# Patient Record
Sex: Female | Born: 1980
Health system: Southern US, Community
[De-identification: ages and names within clinical notes are randomized; demographics above are authoritative.]

## PROBLEM LIST (undated history)

## (undated) DIAGNOSIS — O139 Gestational [pregnancy-induced] hypertension without significant proteinuria, unspecified trimester: Secondary | ICD-10-CM

## (undated) DIAGNOSIS — T8859XA Other complications of anesthesia, initial encounter: Secondary | ICD-10-CM

## (undated) DIAGNOSIS — T4145XA Adverse effect of unspecified anesthetic, initial encounter: Secondary | ICD-10-CM

## (undated) HISTORY — PX: WISDOM TOOTH EXTRACTION: SHX21

## (undated) HISTORY — DX: Other complications of anesthesia, initial encounter: T88.59XA

## (undated) HISTORY — DX: Adverse effect of unspecified anesthetic, initial encounter: T41.45XA

## (undated) HISTORY — DX: Morbid (severe) obesity due to excess calories: E66.01

---
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
## 2015-05-20 ENCOUNTER — Encounter: Payer: Self-pay | Admitting: Physician Assistant

## 2015-05-20 ENCOUNTER — Ambulatory Visit: Payer: Self-pay | Admitting: Family

## 2015-05-20 VITALS — BP 130/90

## 2015-05-20 DIAGNOSIS — K0889 Other specified disorders of teeth and supporting structures: Secondary | ICD-10-CM

## 2015-05-20 MED ORDER — OXYCODONE-ACETAMINOPHEN 5-325 MG PO TABS: 1.0000 | ORAL_TABLET | ORAL | Status: DC | PRN

## 2015-05-20 MED ORDER — PENICILLIN V POTASSIUM 500 MG PO TABS: 500.0000 mg | ORAL_TABLET | Freq: Four times a day (QID) | ORAL | Status: DC

## 2015-05-20 NOTE — Progress Notes (Signed)
S/ 6 d hx of toothache, her dental insurance has not started. Severe pain and inability to close jaw L , no fever or systemic sxs  O/ swelling and abcess left gum. With tenderness In acute distress , left jaw swollen, + cervical nodes  A/ Toothache /abcess with adenitis  P / PCN 500 mg . Tylenol /HC 5 mg i po q 4-6 hours prn #20 0 rf. Back up method x one month. Saline gargles. Topical ice or heat. Cold fluids If sxs not improving seek urgent care.

## 2015-10-20 ENCOUNTER — Emergency Department
Admission: EM | Admit: 2015-10-20 | Discharge: 2015-10-20 | Disposition: A | Discharge status: Home / Self Care | Payer: 59 | Attending: Emergency Medicine | Admitting: Emergency Medicine

## 2015-10-20 ENCOUNTER — Emergency Department: Payer: 59

## 2015-10-20 ENCOUNTER — Encounter: Payer: Self-pay | Admitting: Emergency Medicine

## 2015-10-20 DIAGNOSIS — G8918 Other acute postprocedural pain: Secondary | ICD-10-CM | POA: Insufficient documentation

## 2015-10-20 DIAGNOSIS — R220 Localized swelling, mass and lump, head: Secondary | ICD-10-CM | POA: Diagnosis not present

## 2015-10-20 DIAGNOSIS — K08409 Partial loss of teeth, unspecified cause, unspecified class: Secondary | ICD-10-CM | POA: Diagnosis not present

## 2015-10-20 DIAGNOSIS — Z98818 Other dental procedure status: Secondary | ICD-10-CM

## 2015-10-20 DIAGNOSIS — R51 Headache: Secondary | ICD-10-CM | POA: Diagnosis not present

## 2015-10-20 DIAGNOSIS — R519 Headache, unspecified: Secondary | ICD-10-CM

## 2015-10-20 LAB — CBC WITH DIFFERENTIAL/PLATELET
BASOS ABS: 0.1 10*3/uL (ref 0–0.1)
Basophils Relative: 1 %
EOS PCT: 4 %
Eosinophils Absolute: 0.6 10*3/uL (ref 0–0.7)
HEMATOCRIT: 34.8 % — AB (ref 35.0–47.0)
Hemoglobin: 11.4 g/dL — ABNORMAL LOW (ref 12.0–16.0)
LYMPHS PCT: 17 %
Lymphs Abs: 2.4 10*3/uL (ref 1.0–3.6)
MCH: 25.8 pg — ABNORMAL LOW (ref 26.0–34.0)
MCHC: 32.7 g/dL (ref 32.0–36.0)
MCV: 78.9 fL — AB (ref 80.0–100.0)
MONO ABS: 0.8 10*3/uL (ref 0.2–0.9)
MONOS PCT: 5 %
NEUTROS ABS: 10.7 10*3/uL — AB (ref 1.4–6.5)
Neutrophils Relative %: 73 %
PLATELETS: 301 10*3/uL (ref 150–440)
RBC: 4.41 MIL/uL (ref 3.80–5.20)
RDW: 16.4 % — AB (ref 11.5–14.5)
WBC: 14.6 10*3/uL — ABNORMAL HIGH (ref 3.6–11.0)

## 2015-10-20 LAB — BASIC METABOLIC PANEL
ANION GAP: 7 (ref 5–15)
BUN: 13 mg/dL (ref 6–20)
CHLORIDE: 104 mmol/L (ref 101–111)
CO2: 23 mmol/L (ref 22–32)
Calcium: 8.6 mg/dL — ABNORMAL LOW (ref 8.9–10.3)
Creatinine, Ser: 0.93 mg/dL (ref 0.44–1.00)
GFR calc non Af Amer: >60 mL/min (ref >60)
Glucose, Bld: 108 mg/dL — ABNORMAL HIGH (ref 65–99)
POTASSIUM: 3.4 mmol/L — AB (ref 3.5–5.1)
Sodium: 134 mmol/L — ABNORMAL LOW (ref 135–145)

## 2015-10-20 MED ORDER — HYDROMORPHONE HCL 1 MG/ML IJ SOLN
0.5000 mg | Freq: Once | INTRAMUSCULAR | Status: AC
  Administered 2015-10-20: 0.5 mg via INTRAVENOUS
  Filled 2015-10-20: qty 1

## 2015-10-20 MED ORDER — LIDOCAINE VISCOUS 2 % MT SOLN: 20.0000 mL | OROMUCOSAL | Status: DC | PRN

## 2015-10-20 MED ORDER — SODIUM CHLORIDE 0.9 % IV SOLN
Freq: Once | INTRAVENOUS | Status: AC
  Administered 2015-10-20: 08:00:00 via INTRAVENOUS

## 2015-10-20 MED ORDER — LIDOCAINE VISCOUS 2 % MT SOLN
15.0000 mL | Freq: Once | OROMUCOSAL | Status: AC
  Administered 2015-10-20: 15 mL via OROMUCOSAL
  Filled 2015-10-20: qty 15

## 2015-10-20 MED ORDER — SODIUM CHLORIDE 0.9 % IV SOLN
3.0000 g | Freq: Once | INTRAVENOUS | Status: AC
  Administered 2015-10-20: 3 g via INTRAVENOUS
  Filled 2015-10-20: qty 3

## 2015-10-20 MED ORDER — DEXAMETHASONE SODIUM PHOSPHATE 10 MG/ML IJ SOLN
10.0000 mg | Freq: Once | INTRAMUSCULAR | Status: AC
  Administered 2015-10-20: 10 mg via INTRAVENOUS
  Filled 2015-10-20: qty 1

## 2015-10-20 MED ORDER — IOPAMIDOL (ISOVUE-300) INJECTION 61%
75.0000 mL | Freq: Once | INTRAVENOUS | Status: AC | PRN
  Administered 2015-10-20: 75 mL via INTRAVENOUS

## 2015-10-20 MED ORDER — IOHEXOL 300 MG/ML  SOLN: 75.0000 mL | Freq: Once | INTRAMUSCULAR | Status: DC | PRN

## 2015-10-20 MED ORDER — CLINDAMYCIN HCL 300 MG PO CAPS: 300.0000 mg | ORAL_CAPSULE | Freq: Three times a day (TID) | ORAL | Status: DC

## 2015-10-20 MED ORDER — OXYCODONE-ACETAMINOPHEN 7.5-325 MG PO TABS: 1.0000 | ORAL_TABLET | ORAL | Status: AC | PRN

## 2015-10-20 NOTE — ED Provider Notes (Signed)
[facility] Emergency Department Provider Note     Time seen: ----------------------------------------- 8:30 AM on 10/20/2015 -----------------------------------------    I have reviewed the triage vital signs and the nursing notes.   HISTORY  Chief Complaint Facial Swelling    HPI Kim Haley is a 35 y.o. female who presents to ER after having swelling to her face since Monday since having her wisdom teeth removed. Patient describes severe pain in the right upper and lower jaw. Patient states during the was tooth extraction she woke up and had to be physically restrained. She describes the surgeon hurriedly taking out the wisdom teeth on the right side. It is the side she has pain on, she denies any fever, denies any difficulty swallowing.   History reviewed. No pertinent past medical history.  There are no active problems to display for this patient.   History reviewed. No pertinent past surgical history.  Allergies Review of patient's allergies indicates no known allergies.  Social History Social History  Substance Use Topics  . Smoking status: Never Smoker   . Smokeless tobacco: None  . Alcohol Use: No    Review of Systems Constitutional: Negative for fever. ENT: Positive for severe right-sided jaw pain Cardiovascular: Negative for chest pain. Respiratory: Negative for shortness of breath. Gastrointestinal: Negative for abdominal pain, vomiting and diarrhea. Skin: Negative for rash.  ____________________________________________   PHYSICAL EXAM:  VITAL SIGNS: ED Triage Vitals  Enc Vitals Group     BP 10/20/15 0723 145/95 mmHg     Pulse Rate 10/20/15 0723 130     Resp 10/20/15 0723 20     Temp 10/20/15 0723 98.3 F (36.8 C)     Temp Source 10/20/15 0723 Oral     SpO2 10/20/15 0723 99 %     Weight 10/20/15 0723 250 lb (113.399 kg)     Height 10/20/15 0723  (1.651 m)     Head Cir --      Peak Flow --      Pain  Score 10/20/15 0723 10     Pain Loc --      Pain Edu? --      Excl. in GC? --     Constitutional: Alert and oriented. Anxious, mild distress Eyes: Conjunctivae are normal. PERRL. Normal extraocular movements. ENT   Head: Normocephalic and atraumatic.   Nose: No congestion/rhinnorhea.   Mouth/Throat: Patient has evidence of trismus, posterior pharynx is difficult to assess.   Neck: No stridor. No adenopathy is noted, floor the mouth is soft Cardiovascular: Rapid rate, regular rhythm. Normal and symmetric distal pulses are present in all extremities. No murmurs, rubs, or gallops. Respiratory: Normal respiratory effort without tachypnea nor retractions. Breath sounds are clear and equal bilaterally. No wheezes/rales/rhonchi. Musculoskeletal: Nontender with normal range of motion in all extremities.  Neurologic:  Normal speech and language.  Skin:  Skin is warm, dry and intact. No rash noted. ____________________________________________  ED COURSE:  Pertinent labs & imaging results that were available during my care of the patient were reviewed by me and considered in my medical decision making (see chart for details). Patient presents status post wisdom tooth extraction with severe right-sided jaw pain, we'll obtain basic labs, give IV Decadron and Unasyn to cover for developing abscess. ____________________________________________    LABS (pertinent positives/negatives)  Labs Reviewed  CBC WITH DIFFERENTIAL/PLATELET - Abnormal; Notable for the following:    WBC 14.6 (*)    Hemoglobin 11.4 (*)    HCT 34.8 (*)  MCV 78.9 (*)    MCH 25.8 (*)    RDW 16.4 (*)    Neutro Abs 10.7 (*)    All other components within normal limits  BASIC METABOLIC PANEL - Abnormal; Notable for the following:    Sodium 134 (*)    Potassium 3.4 (*)    Glucose, Bld 108 (*)    Calcium 8.6 (*)    All other components within normal limits  POC URINE PREG, ED    RADIOLOGY Images were viewed  by me  CT maxillofacial with contrast  IMPRESSION: Status post superior and inferior third molar extractions bilaterally with bony thinning in the areas of the extraction. No enhancement or abscess is noted in these areas. Note that there is disruption of cortex between the left superior alveolar ridge in inferior maxillary antrum on the left at the site of third molar extraction. This disruption increases risk of potential infection from the left maxillary sinus in the area of extraction. This finding will warrant close clinical surveillance.  Multifocal sinusitis and polyposis. Note that there is widening of the ostiomeatal unit complex on the left with obstruction, likely due to polyposis. There is left nasal polyposis with left nares obstruction. There is an air-fluid level in the left maxillary antrum which could represent either infection or potential hemorrhage from the recent third molar extraction.  Mild lymph node prominence inferior to the right mandible, likely of reactive etiology.  Bony structures elsewhere appear unremarkable. No intraorbital lesions. Pharyngeal regions appear normal. ____________________________________________  FINAL ASSESSMENT AND PLAN  Postoperative pain  Plan: Patient with labs and imaging as dictated above. Patient is currently feeling better, I will increase her pain medicine at home and switch her to clindamycin. She cannot see use viscous lidocaine as needed. I will advise close follow-up with her oral surgeon.   Emily FilbertWilliams, Courtland Reas E, MD   Emily FilbertJonathan E Romy Ipock, MD 10/20/15 [phone]

## 2015-10-20 NOTE — ED Notes (Signed)
Pt to ed with c/o swelling to face since Monday after having wisdom teeth removed.  Pt tearful heart rate 130.

## 2015-10-20 NOTE — Discharge Instructions (Signed)
Pain Relief Preoperatively and Postoperatively If you have questions, problems, or concerns about the pain that you may feel after surgery, let your health care provider know.Patients have the right to assessment and management of pain. Severe pain after surgery--and the fear or anxiety associated with that pain--may cause extreme discomfort that:  Prevents sleep.  Decreases the ability to breathe deeply and to cough. This can result in pneumonia or other upper airway infections.  Causes the heart to beat more quickly and the blood pressure to be higher.  Increases the risk for constipation and bloating.  Decreases the ability of wounds to heal.  May result in depression, increased anxiety, and feelings of helplessness. Relieving pain before surgery (preoperatively) is also important because it lessens pain that you have after surgery (postoperatively). Patients who receive pain relief both before and after surgery experience greater pain relief than those who receive pain relief only after surgery. Let your health care provider know if you are having uncontrolled pain.This is very important.Pain after surgery is more difficult to manage if it is severe, so receiving prompt and adequate treatment of acute pain is necessary. If you become constipated after taking pain medicine, drink more liquids if you can. Your health care provider may have you take a mild laxative. PAIN CONTROL METHODS Your health care providers follow policies and procedures about the management of your pain.These guidelines should be explained to you before surgery.Plans for pain control after surgery must be decided upon by you and your health care provider and put into use with your full understanding and agreement.Do not be afraid to ask questions about the care that you are receiving. Your health care providers will attempt to control your pain in various ways, and these methods may be used together (multimodal  analgesia). Using this approach has many benefits for you, including being able to eat, move around, and leave the hospital sooner. As-Needed Pain Control  You may be given pain medicine through an IV tube or as a pill or liquid that you can swallow. Let your health care provider know when you are having pain, and he or she will give you the pain medicine that is ordered for you. IV Patient-Controlled Analgesia (PCA) Pump  You can receive your pain medicine through an IV tube that goes into one of your veins. You can control the amount of pain medicine that you get. The pain medicine is controlled by a pump. When you push the button that is hooked up to this pump, you receive a specific amount of pain medicine. This button should be pushed only by you or by someone who is specifically assigned by you to do so. It is set up to keep you from accidentally giving yourself too much pain medicine. You will be able to start using your pain pump in the recovery room after your surgery. This method can be helpful for most types of surgery.  Tell your health care provider:  If you are having too much pain.  If you are feeling too sleepy or nauseous. Continuous Epidural Pain Control  A thin, soft tube (catheter) is put into your back, outside the outer layer of your spinal cord. Pain medicine flows through the catheter to lessen pain in areas of your body that are below the level of catheter placement. Continuous epidural pain control may work best for you if you are having surgery on your abdomen, hip area, or legs. The epidural catheter is usually put into your back shortly  before surgery. It is left in until you can eat, take medicine by mouth, pass urine, and have a bowel movement.  Giving pain medicine through the epidural catheter may help you to heal more quickly because you can do these things sooner:  Regain normal bowel and bladder function.  Return to eating.  Get up and walk. Medicine That  Numbs the Area (Local Anesthetic) You may be given pain medicine:  As an injection near the area of the pain (local infiltration).  As an injection near the nerve that controls the sensation to a specific part of your body (peripheral nerve block).  In your spine to block pain (spinal block).  Through a local anesthetic reservoir pump. If your surgeon or anesthesiologist selects this option as a part of your pain control, one or more thin, soft tubes will be inserted into your incision site(s) at the end of surgery. These tubes will be connected to a device that is filled with a non-narcotic pain medicine. This medicine gradually empties into your incision site over the next several days. Usually, after all of the medicine is used, your health care provider will remove the tubes and throw away the device. Opioids  Moderate to moderately severe acute pain after surgery may respond to opioids.Opioids are narcotic pain medicine. Opioids are often combined with non-narcotic medicines to improve pain relief, lower the risk of side effects, and reduce the chance of addiction.  If you follow your health care provider's directions about taking opioids and you do not have a history of substance abuse, your risk of becoming addicted is very small.To prevent addiction, opioids are given for short periods of time in careful doses. Other Methods of Pain Control  Steroids.  Physical therapy.  Heat and cold therapy.  Compression, such as wrapping an elastic bandage around the area of the pain.  Massage.   This information is not intended to replace advice given to you by your health care provider. Make sure you discuss any questions you have with your health care provider.   Document Released: 10/06/2002 Document Revised: 08/06/2014 Document Reviewed: 10/10/2010 Elsevier Interactive Patient Education 2016 Elsevier Inc. Dental Extraction A dental extraction is the removal (extraction) of a tooth.  You may need to have a dental extraction if:   You have tooth decay or gum disease.  You have an infection (abscess).  Room needs to be made for other teeth to grow in or to be aligned properly.  Baby (primary) teeth are preventing adult (permanent) teeth from coming to the surface (erupting).  You have a tooth fracture or fractures that are not repairable.  You are going to be having radiation to your head and neck. The type and length of procedure that you have depends on the reason for the extraction and the placement of the tooth or teeth that are being removed. The procedure may be:  A simple extraction. This is done if the tooth is visible in the mouth and is above the gumline.  A surgical extraction. This is done if the tooth has not come into the mouth or if the tooth is broken off below the gumline. LET [facility] CARE PROVIDER KNOW ABOUT:  Any allergies you have.  All medicines you are taking, including vitamins, herbs, eye drops, creams, and over-the-counter medicines.  Previous problems you or members of your family have had with the use of anesthetics.  Any blood disorders you have.  Previous surgeries you have had.  Any medical conditions  you may have. RISKS AND COMPLICATIONS Generally, this is a safe procedure. However, problems may occur, including:  Damage to surrounding teeth, nerves, tissues, or structures.  The blood clot does not form or stay in place where the tooth was removed. This causes the bones and nerves underneath to be exposed (dry socket). This can delay healing.  Incomplete extraction of roots.  Jawbone injury, pain, or weakness. BEFORE THE PROCEDURE  Ask your health care provider about:  Changing or stopping your regular medicines. This is especially important if you are taking diabetes medicines or blood thinners.  Taking medicines such as aspirin and ibuprofen. These medicines can thin your blood. Do not take these medicines before  your procedure if your health care provider instructs you not to.  Take medicines, such as antibiotic medicines, as directed by your health care provider.  Follow instructions from your health care provider about eating or drinking restrictions.  Plan to have someone take you home after the procedure.  If you go home right after the procedure, plan to have someone with you for 24 hours. PROCEDURE  You may be given one or more of the following:  A medicine that helps you relax (sedative).  A medicine that numbs the area (local anesthetic).  A medicine that makes you fall asleep (general anesthetic).  If you are having a simple extraction:  Your dentist will loosen the tooth with an instrument called an elevator.  Another instrument called forceps will be used to grasp the tooth and remove it from the socket.  The open socket will be cleaned.  Gauze will be placed in the socket to reduce bleeding.  If you are having a surgical extraction:  Your dentist will make an incision in the gum.  Some of the bone around the tooth may need to be removed.  The tooth will be removed.  Stitches (sutures) may be required to close the area. The procedure may vary among health care providers and hospitals. AFTER THE PROCEDURE  You may have gauze in your mouth where the tooth was removed. If directed by your health care provider, apply gentle pressure on the gauze for up to one hour after the procedure. This will help to control bleeding.  A blood clot should begin to form over the open socket. This is normal. Do not touch the area, and do not rinse it.  You may be given medicines to help control pain and help your recovery.   This information is not intended to replace advice given to you by your health care provider. Make sure you discuss any questions you have with your health care provider.   Document Released: 07/16/2005 Document Revised: 11/30/2014 Document Reviewed:  07/12/2014 Elsevier Interactive Patient Education [employer] Elsevier Inc[employer]

## 2017-02-07 ENCOUNTER — Emergency Department (HOSPITAL_COMMUNITY)
Admission: EM | Admit: 2017-02-07 | Discharge: 2017-02-07 | Disposition: A | Discharge status: Home / Self Care | Payer: 59 | Attending: Emergency Medicine | Admitting: Emergency Medicine

## 2017-02-07 ENCOUNTER — Encounter (HOSPITAL_COMMUNITY): Payer: Self-pay | Admitting: Emergency Medicine

## 2017-02-07 DIAGNOSIS — H60331 Swimmer's ear, right ear: Secondary | ICD-10-CM | POA: Diagnosis not present

## 2017-02-07 DIAGNOSIS — B9789 Other viral agents as the cause of diseases classified elsewhere: Secondary | ICD-10-CM | POA: Diagnosis not present

## 2017-02-07 DIAGNOSIS — J3489 Other specified disorders of nose and nasal sinuses: Secondary | ICD-10-CM | POA: Insufficient documentation

## 2017-02-07 DIAGNOSIS — J019 Acute sinusitis, unspecified: Secondary | ICD-10-CM | POA: Insufficient documentation

## 2017-02-07 DIAGNOSIS — H9201 Otalgia, right ear: Secondary | ICD-10-CM | POA: Diagnosis present

## 2017-02-07 MED ORDER — CIPROFLOXACIN-DEXAMETHASONE 0.3-0.1 % OT SUSP: 4.0000 [drp] | Freq: Two times a day (BID) | OTIC | 0 refills | Status: AC

## 2017-02-07 MED ORDER — FLUTICASONE PROPIONATE 50 MCG/ACT NA SUSP: 2.0000 | Freq: Every day | NASAL | 0 refills | Status: DC

## 2017-02-07 NOTE — Discharge Instructions (Signed)
You were seen in the emergency department for evaluation of right ear pain. You have a right ear infection. Additionally you have symptoms of acute viral sinusitis.  Please use antibiotic Ciprodex drops as prescribed. Additionally, use intranasal Flonase to help with nasal congestion and inflammation.   Additionally you can purchase over the counter tylenol for ear pain, Claritin or Zyrtec for nasal congestion and Sudafed (or any other decongestant) to help with nasal drainage.   An uncomplicated ear infection and viral sinusitis start to improve and resolve completely within 7-10 days. Please monitor your symptoms, follow up with your primary care provider if your symptoms persist for more than 10 days.

## 2017-02-07 NOTE — ED Provider Notes (Signed)
[facility]ENC[facility] Provider Note   CSN: [id number] Arrival date & time: 02/07/17  4098     History   Chief Complaint Chief Complaint  Patient presents with  . Otalgia    HPI Kim Haley is a 36 y.o. female presents to the ED for evaluation of right otalgia associated with nasal congestion, rhinorrhea, sore throat, nonproductive cough for the past 4 days. Patient was at the beach last week prior to symptom onset. Describes right ear pain as pressure and fullness. No drainage, tinnitus, dizziness. No alleviating or aggravating factors. History of allergic rhinitis, does not take any medications for it.  HPI  History reviewed. No pertinent past medical history.  There are no active problems to display for this patient.   History reviewed. No pertinent surgical history.  OB History    Gravida Para Term Preterm AB Living   1         1   SAB TAB Ectopic Multiple Live Births                   Home Medications    Prior to Admission medications   Medication Sig Start Date End Date Taking? Authorizing Provider  ciprofloxacin-dexamethasone (CIPRODEX) OTIC suspension Place 4 drops into both ears 2 (two) times daily. 02/07/17 02/14/17  Liberty Handy, PA-C  clindamycin (CLEOCIN) 300 MG capsule Take 1 capsule (300 mg total) by mouth 3 (three) times daily. 10/20/15   Emily Filbert, MD  fluticasone (FLONASE) 50 MCG/ACT nasal spray Place 2 sprays into both nostrils daily. FOR NASAL CONGESTION 02/07/17   Liberty Handy, PA-C  lidocaine (XYLOCAINE) 2 % solution Use as directed 20 mLs in the mouth or throat as needed for mouth pain. 10/20/15   Emily Filbert, MD  norethindrone (MICRONOR,CAMILA,ERRIN) 0.35 MG tablet Take 1 tablet by mouth daily.    [provider]  oxyCODONE-acetaminophen (ROXICET) 5-325 MG tablet Take 1 tablet by mouth every 4 (four) hours as needed for severe pain (every 4- 6 hours prn). 05/20/15   Francesco Sor, NP  penicillin v  potassium (VEETID) 500 MG tablet Take 1 tablet (500 mg total) by mouth 4 (four) times daily. 05/20/15   Francesco Sor, NP    Family History Family History  Problem Relation Age of Onset  . Cancer Maternal Aunt     Social History Social History  Substance Use Topics  . Smoking status: Never Smoker  . Smokeless tobacco: Not on file  . Alcohol use No     Allergies   Patient has no known allergies.   Review of Systems Review of Systems  Constitutional: Negative for fever.  HENT: Positive for congestion, ear pain, postnasal drip, rhinorrhea, sinus pressure and sore throat. Negative for facial swelling, sneezing and tinnitus.   Eyes: Negative for pain, discharge and redness.  Respiratory: Positive for cough.   Cardiovascular: Negative for chest pain.  Gastrointestinal: Negative for abdominal pain.  Neurological: Negative for headaches.     Physical Exam Updated Vital Signs BP (!) 141/94   Pulse (!) 103   Temp 98.2 F (36.8 C) (Oral)   Resp 17   Ht 5' 5.5" (1.664 m)   Wt 113.4 kg (250 lb)   LMP 01/27/2017 (Exact Date)   SpO2 97%   BMI 40.97 kg/m   Physical Exam  Constitutional: She is oriented to person, place, and time. She appears well-developed and well-nourished. No distress.  NAD.  HENT:  Head: Normocephalic and atraumatic.  Right  Ear: Tympanic membrane normal. There is swelling and tenderness.  Left Ear: Tympanic membrane and external ear normal.  Nose: Mucosal edema and rhinorrhea present.  Mouth/Throat: Uvula is midline and mucous membranes are normal. Posterior oropharyngeal erythema present. No posterior oropharyngeal edema. Tonsils are 1+ on the right. Tonsils are 1+ on the left.  R middle ear canal erythematous and mildly edematous Pain with right ear tug No mastoid or preauricular tenderness R TM with circumferential erythema but no injection, bulging, cloudiness or perforation Oropharynx erythema without edema L external and middle ear normal,  TM normal No sinus tenderness  Tonsils normal, no exudates   Eyes: Pupils are equal, round, and reactive to light. Conjunctivae and EOM are normal. No scleral icterus.  Neck: Normal range of motion. Neck supple.  Cardiovascular: Normal rate, regular rhythm, S1 normal, S2 normal and normal heart sounds.   No murmur heard. Pulmonary/Chest: Effort normal and breath sounds normal. She has no decreased breath sounds. She has no wheezes.  Musculoskeletal: Normal range of motion. She exhibits no deformity.  Neurological: She is alert and oriented to person, place, and time.  Skin: Skin is warm and dry. Capillary refill takes less than 2 seconds.  Psychiatric: She has a normal mood and affect. Her behavior is normal. Judgment and thought content normal.  Nursing note and vitals reviewed.    ED Treatments / Results  Labs (all labs ordered are listed, but only abnormal results are displayed) Labs Reviewed - No data to display  EKG  EKG Interpretation None       Radiology No results found.  Procedures Procedures (including critical care time)  Medications Ordered in ED Medications - No data to display   Initial Impression / Assessment and Plan / ED Course  I have reviewed the triage vital signs and the nursing notes.  Pertinent labs & imaging results that were available during my care of the patient were reviewed by me and considered in my medical decision making (see chart for details).    36 year old female presents to the ED for evaluation of right otalgia. Associated symptoms include nasal congestion, rhinorrhea, sore throat, nonproductive cough. Was at the beach prior to symptom onset. Exam consistent with uncomplicated otitis externa, superimposed acute sinusitis most likely viral. No fever. Oropharynx mildly erythematous, tonsils normal. We'll discharge with antibiotic otic drops and conservative therapy for uncomplicated viral sinusitis. Discussed plans with patient who  agrees with treatment plan. Advised her to monitor her symptoms and follow-up with PCP for worsening or persistent sinusitis problems. She verbalized understanding and is agreeable with plan.  Final Clinical Impressions(s) / ED Diagnoses   Final diagnoses:  Acute swimmer's ear of right side  Acute viral sinusitis    New Prescriptions New Prescriptions   CIPROFLOXACIN-DEXAMETHASONE (CIPRODEX) OTIC SUSPENSION    Place 4 drops into both ears 2 (two) times daily.   FLUTICASONE (FLONASE) 50 MCG/ACT NASAL SPRAY    Place 2 sprays into both nostrils daily. FOR NASAL CONGESTION     Liberty HandyGibbons, Arthi Mcdonald J, PA-C 02/07/17 [id number]    Rolland PorterJames, Mark, MD 02/13/17 [phone]

## 2017-02-07 NOTE — ED Triage Notes (Signed)
Pt reports that she just returned from the beach and now her right ear is causing her pain.  Yesterday her left ear was causing her pain however today it is "just clogged up."  Pt also reports a sore throat since Monday.  Denies n/v/d, dizziness or weakness.

## 2017-03-22 ENCOUNTER — Ambulatory Visit: Payer: Self-pay | Admitting: Family

## 2017-03-22 VITALS — BP 130/100 | HR 99 | Temp 98.0°F

## 2017-03-22 DIAGNOSIS — L255 Unspecified contact dermatitis due to plants, except food: Secondary | ICD-10-CM

## 2017-03-22 MED ORDER — HYDROCORTISONE 0.5 % EX CREA: 1.0000 "application " | TOPICAL_CREAM | Freq: Two times a day (BID) | CUTANEOUS | 0 refills | Status: DC

## 2017-03-22 NOTE — Progress Notes (Signed)
S/ rash to arms , trunk , face after walking in the woods yesterday. Itching  , remote hx of allergy to poison ivy,etc no new meds, detergents, soap etc O/ alert pleasant ,  At present a few red isolated papules to each forearm and back  A/ dermatitis -suspect plant P / continue otc claritin,benadryl. rx for HC cream. .supportive measures discussed f/u prn not improving.

## 2017-06-14 ENCOUNTER — Ambulatory Visit (INDEPENDENT_AMBULATORY_CARE_PROVIDER_SITE_OTHER): Payer: 59 | Admitting: Obstetrics and Gynecology

## 2017-06-14 ENCOUNTER — Encounter: Payer: Self-pay | Admitting: Obstetrics and Gynecology

## 2017-06-14 VITALS — BP 138/94 | Wt 266.0 lb

## 2017-06-14 DIAGNOSIS — Z3A01 Less than 8 weeks gestation of pregnancy: Secondary | ICD-10-CM | POA: Diagnosis not present

## 2017-06-14 DIAGNOSIS — O0990 Supervision of high risk pregnancy, unspecified, unspecified trimester: Secondary | ICD-10-CM | POA: Diagnosis not present

## 2017-06-14 DIAGNOSIS — O99210 Obesity complicating pregnancy, unspecified trimester: Secondary | ICD-10-CM | POA: Diagnosis not present

## 2017-06-14 DIAGNOSIS — O169 Unspecified maternal hypertension, unspecified trimester: Secondary | ICD-10-CM

## 2017-06-14 DIAGNOSIS — O09529 Supervision of elderly multigravida, unspecified trimester: Secondary | ICD-10-CM | POA: Diagnosis not present

## 2017-06-14 MED ORDER — FOLIC ACID 1 MG PO TABS: 1.0000 mg | ORAL_TABLET | Freq: Every day | ORAL | 10 refills | Status: DC

## 2017-06-14 MED ORDER — DOXYLAMINE-PYRIDOXINE ER 20-20 MG PO TBCR: 1.0000 | EXTENDED_RELEASE_TABLET | Freq: Two times a day (BID) | ORAL | 2 refills | Status: AC

## 2017-06-14 NOTE — Progress Notes (Signed)
New Obstetric Patient H&P    Chief Complaint: "Desires prenatal care"   History of Present Illness: Patient is a 36 y.o. G2P0 Not Hispanic or Latino female, presents with amenorrhea and positive home pregnancy test. Based on her  LMP, her Estimated Date of Delivery: 02/12/18 and her 149w2d.  She had a urine pregnancy test which was positive 1 week(s)  ago. Her last menstrual period was normal. Since her LMP she claims she has experienced nausea, fatigue, breast tenderness. She denies vaginal bleeding. Her past medical history is noncontributory. Her prior pregnancies are notable for vaginal delivery, polyhydramnios (normal GDM screening)  Since her LMP, she admits to the use of tobacco products  no She claims she has gained   no pounds since the start of her pregnancy.  There are cats in the home in the home  no  She admits close contact with children on a regular basis  yes  She has had chicken pox in the past no She has had Tuberculosis exposures, symptoms, or previously tested positive for TB   no Current or past history of domestic violence. no  Genetic Screening/Teratology Counseling: (Includes patient, baby's father, or anyone in either family with:)   1. [facility]  yes 2. Thalassemia ([place], [place], Mediterranean, or Asian background): MCV<80  no 3. Neural tube defect (meningomyelocele, spina bifida, anencephaly)  no 4. Congenital heart defect  no  5. Down syndrome  no 6. Tay-Sachs (Jewish, [place])  no 7. Canavan's Disease  no 8. Sickle cell disease or trait (African)  no  9. Hemophilia or other blood disorders  no  10. Muscular dystrophy  no  11. Cystic fibrosis  no  12. Huntington's Chorea  no  13. Mental retardation/autism  no 14. Other inherited genetic or chromosomal disorder  no 15. Maternal metabolic disorder (DM, PKU, etc)  no 16. Patient or FOB with a child with a birth defect not listed above no  16a. Patient or FOB with a birth defect  themselves no 17. Recurrent pregnancy loss, or stillbirth  no  18. Any medications since LMP other than prenatal vitamins (include vitamins, supplements, OTC meds, drugs, alcohol)  no 19. Any other genetic/environmental exposure to discuss  no  Infection History:   1. Lives with someone with TB or TB exposed  no  2. Patient or partner has history of genital herpes  no 3. Rash or viral illness since LMP  no 4. History of STI (GC, CT, HPV, syphilis, HIV)  no 5. History of recent travel :  no  Other pertinent information:  yes   FOB with SVT  Review of Systems:10 point review of systems negative unless otherwise noted in HPI  Past Medical History:  No past medical history on file.  Past Surgical History:  No past surgical history on file.  Gynecologic History: Patient's last menstrual period was 05/08/2017.  Obstetric History: G2P0  Family History:  Family History  Problem Relation Age of Onset  . Cancer Maternal Aunt     Social History:  Social History   Socioeconomic History  . Marital status: Married    Spouse name: Not on file  . Number of children: Not on file  . Years of education: Not on file  . Highest education level: Not on file  Social Needs  . Financial resource strain: Not on file  . Food insecurity - worry: Not on file  . Food insecurity - inability: Not on file  .  Transportation needs - medical: Not on file  . Transportation needs - non-medical: Not on file  Occupational History  . Not on file  Tobacco Use  . Smoking status: Never Smoker  Substance and Sexual Activity  . Alcohol use: No    Alcohol/week: 0.0 oz  . Drug use: Not on file  . Sexual activity: Not on file  Other Topics Concern  . Not on file  Social History Narrative  . Not on file    Allergies:  No Known Allergies  Medications: Prior to Admission medications   Medication Sig Start Date End Date Taking? Authorizing Provider  clindamycin (CLEOCIN) 300 MG capsule Take 1  capsule (300 mg total) by mouth 3 (three) times daily. Patient not taking: Reported on 03/22/2017 10/20/15   Emily Filbert, MD  fluticasone [facility]) 50 MCG/ACT nasal spray Place 2 sprays into both nostrils daily. FOR NASAL CONGESTION 02/07/17   Liberty Handy, PA-C  hydrocortisone cream 0.5 % Apply 1 application topically 2 (two) times daily. 03/22/17   Francesco Sor, NP  lidocaine (XYLOCAINE) 2 % solution Use as directed 20 mLs in the mouth or throat as needed for mouth pain. Patient not taking: Reported on 03/22/2017 10/20/15   Emily Filbert, MD  norethindrone (MICRONOR,CAMILA,ERRIN) 0.35 MG tablet Take 1 tablet by mouth daily.    [provider]  oxyCODONE-acetaminophen (ROXICET) 5-325 MG tablet Take 1 tablet by mouth every 4 (four) hours as needed for severe pain (every 4- 6 hours prn). Patient not taking: Reported on 03/22/2017 05/20/15   Francesco Sor, NP  penicillin v potassium (VEETID) 500 MG tablet Take 1 tablet (500 mg total) by mouth 4 (four) times daily. Patient not taking: Reported on 03/22/2017 05/20/15   Francesco Sor, NP    Physical Exam Vitals: Blood pressure (!) 138/94, weight 266 lb (120.7 kg), last menstrual period 01/27/2017. Body mass index is 43.59 kg/m.  General: NAD HEENT: normocephalic, anicteric Thyroid: no enlargement, no palpable nodules Pulmonary: No increased work of breathing, CTAB Cardiovascular: RRR, distal pulses 2+ Abdomen: NABS, soft, non-tender, non-distended.  Umbilicus without lesions.  No hepatomegaly, splenomegaly or masses palpable. No evidence of hernia  Genitourinary:  External: Normal external female genitalia.  Normal urethral meatus, normal  Bartholin's and Skene's glands.    Vagina: Normal vaginal mucosa, no evidence of prolapse.    Cervix: Grossly normal in appearance, no bleeding  Uterus: Non-enlarged, mobile, normal contour.  No CMT  Adnexa: ovaries non-enlarged, no adnexal masses  Rectal:  deferred Extremities: no edema, erythema, or tenderness Neurologic: Grossly intact Psychiatric: mood appropriate, affect full   Assessment: 36 y.o. G2P0 at Unknown presenting to initiate prenatal care  Plan: 1) Avoid alcoholic beverages. 2) Patient encouraged not to smoke.  3) Discontinue the use of all non-medicinal drugs and chemicals.  4) Take prenatal vitamins daily.  5) Nutrition, food safety (fish, cheese advisories, and high nitrite foods) and exercise discussed. 6) Hospital and practice style discussed with cross coverage system.  7) Genetic Screening, such as with 1st Trimester Screening, cell free fetal DNA, AFP testing, and Ultrasound, as well as with amniocentesis and CVS as appropriate, is discussed with patient. At the conclusion of today's visit patient undecided genetic testing 8) Patient is asked about travel to areas at risk for the [place] virus, and counseled to avoid travel and exposure to mosquitoes or sexual partners who may have themselves been exposed to the virus. Testing is discussed, and will be ordered as appropriate.  9) Start low dose ASA at >[redacted] weeks gestation as per USPTF recommendation "Low-Dose Aspirin Use for the Prevention of Morbidity and Mortality From Preeclampsia: Preventive Medicine"  furthermore endorsed by ACOG, WHO, and NIH based on evidence level B for the prevention of preeclampsia  In women deemed high risk  (diabetes, renal disease, chronic hypertension, history of preeclampsia in prior gestation, autoimmune diseases, or multifetal gestations).  ACOG Committee Opinion 743 "Low-Dose Asprin Use During Pregnancy" June 25th 2018  High Risk (Start if 1 or more present) History of preeclampsia Multifetal Gestation Chronic HTN ? Type I or II DM Renal Disease Autoimmune Disease (SLE, Antiphospholipid antibody syndrome)  Moderate Risk (consider starting if more than one present) Nulliparity Obesity (BMI >30) Family history of Preeclampsia (Mother  or sister) Socioeconomic characteristics (African American, low socieeconomic status) Age 66 years or older Personal history factors (low birthweight of SGA, previous adverse pregnancy outcome, more than 10 year pregnancy interval)

## 2017-06-14 NOTE — Progress Notes (Signed)
NOB Pt has nausea

## 2017-06-14 NOTE — Patient Instructions (Signed)
Start low dose ASA at >[redacted] weeks gestation as per USPTF recommendation "Low-Dose Aspirin Use for the Prevention of Morbidity and Mortality From Preeclampsia: Preventive Medicine"  furthermore endorsed by ACOG, WHO, and NIH based on evidence level B for the prevention of preeclampsia  In women deemed high risk  (diabetes, renal disease, chronic hypertension, history of preeclampsia in prior gestation, autoimmune diseases, or multifetal gestations).  ACOG Committee Opinion 743 "Low-Dose Asprin Use During Pregnancy" June 25th 2018  High Risk (Start if 1 or more present) History of preeclampsia Multifetal Gestation Chronic HTN Type I or II DM Renal Disease Autoimmune Disease (SLE, Antiphospholipid antibody syndrome)  Moderate Risk (consider starting if more than one present) Nulliparity Obesity (BMI >30) Family history of Preeclampsia (Mother or sister) Socioeconomic characteristics (African American, low socieeconomic status) Age 18 years or older  Personal history factors (low birthweight of SGA, previous adverse pregnancy outcome, more than 10 year pregnancy interval)  This is a very exciting time for you and your family, so congratulations from everybody here at [facility]! You have just embarked on a very amazing journey. The next several months will be filled with wondrous emotions and miraculous memories. As you begin your preparations, this office wanted you to be aware of a few prenatal genetic laboratory tests that are available to you early in your pregnancy. These tests are optional and you may decide to opt out of the testing.   Patients often voice their desire to opt out of this testing as it would not change their decisions on continuing the pregnancy.  However, our providers value the information they receive from these tests as they allow [place] to optimize the care for you and your baby prior to delivery.    There are 6 genetic laboratory tests this office offers, and they test for a  variety of different genetic diseases or chromosomal abnormalities. By utilizing these tests, the providers can better understand your risk associated with passing on a genetic condition to your child. These tests are screening tests, and are not used to diagnose any condition. If one of these tests results abnormal, then a diagnostic test will be offered to you. It is important to remember that most pregnancies will result in a beautiful and healthy baby. Knowing the results of these tests will also help you better prepare for your delivery.  We encourage you to read over the brief descriptions below and engage with your provider regarding any additional questions you may have.  Please also make your provider aware if you or your partner has any Jewish ancestry as there may be additional testing that you qualify for. The tests that will be ordered are: 1. Cystic Fibrosis Carrier Screen1 (Blood Test): The most common autosomal recessive disorder. This disease causes thick mucus to build up in the lungs, which leads to repetitive chest infections. The carrier frequency in caucasians is roughly 1 in 30 people in the [place], but varies by ethnicity.   2. Spinal Muscular Atrophy Carrier Screen1 (Blood Test):  The second most common autosomal recessive disorder and the most common inherited form of early childhood death. This is degenerative neuromuscular condition that affects the child's ability to sit, smile, breath, swallow, etc. The carrier frequency is the [place] is roughly 1 in 66, but varies by ethnicity. 3. Fragile X Syndrome Carrier Screen1 (Blood Test): The most common form of inherited mental retardation. The severity of the retardation varies. Fragile X is most commonly passed from mother to child, and  1 in 260 people in the [place] are carriers. 4. Downs Syndrome, Trisomy 18: Trisomy 72 ( Downs Syndrome) and Trisomy 18 are common forms of mental retardation due to chromosomal  abnormalities. Jeral Pinch is the milder of the two, and it occurs 1 in 700 live births. Trisomy 63 is a more severe form of mental retardation, and occurs in 1 in 6000 live births. The risk of conceiving a baby with one of these two genetic conditions is independent of family history.  The test offered to you will depend on how far along you are in your pregnancy.  The main risk factor that has been identified as predisposing to either trisomy 27 or trisomy 24 is the age of the mother, with increased risk for those patients over the age of 52 at the time of delivery.  If you are deemed high risk on the initial screening test or are over 35 you may be a candidate for cell free fetal DNA testing or amniocentesis outlined below.  See #6, 7, 8, and 9 for available testing options 5.  MSAFP2 Maternal Serum Alpha Feto-Protein (Blood Test):  Open Neural Tube Defects deal with an opening in the spinal column that does not close properly during pregnancy. It occurs when the tissues that fold to form the neural tube do not close or do not stay closed completely. This causes an opening in the vertebrae, which surround and protect the spinal cord. The most common form is Spina Bifida. It occurs in 1 in 1000 live births. The folic acid in prenatal vitamins can decrease the risk of you baby developing this birth defect.  If you have had a prior pregnancy complicated by spina bifida you may need higher doses of folic acid.   6. 1st trimester screening2 (Blood Test):  this test is a combination of an ultrasound measurement of your baby's neck and blood work ideally obtained between 11 weeks and 13 weeks 6 days gestation.  It tests for #4 above 7. Tetra Screen2 (Blood Test):  this is a combined blood test offered at 15 to [redacted] weeks gestation.  It tests for conditions #4 and #5 above 8. Cell Free Fetal DNA2 (Blood Test):  this test is available for our patient over 35 or patient who were found to be at increased risk for down syndrome  or trisomy 18 based on either 1st trimester screening results or serum tetra screening.  At present this testing is still considered screening rather than diagnostic 9. Amniocentesis - this test is available for our patient over 35 or patient who were found to be at increased risk for down syndrome or trisomy 18 based on either 1st trimester screening results or serum tetra screening.  This test involves using a needle to draw off some of the fluid from around the baby in order to determine if the fetus is affected by either trisomy 21 (Downs Syndrome) or trisomy 5.  In addition this test may be suggested or offered by your provider in other circumstances.  This test is considered diagnostic as opposed to screening meaning that it can definitively rule in or rule out the tested condition.  Unlike the other testing discussed amniocentesis is an invasive procedure and is associated with a small risk (approximately 1 in 200) of resulting in a miscarriage. 1. It is also important to notate that if you screen positive for carrier status for Cystic Fibrosis or Spinal Muscular Atrophy, that does not mean your child will be affected with  the condition. The child's father will also need to be tested. If both of you are carriers, then there is a 25% that you will have an affected child. The risk of having a child affected with Downs Syndrome or Trisomy 7818 varies by maternal age. There is not a "carrier" status for these conditions. If you would like more information of these conditions, please see the handouts in your packet of information. 2. Denotes screening tests.   These tests can assess the risk of the pregnancy being affected by a particular condition.  It is important to note that even if testing deems that you are at increased risk your baby may still be unaffected.  Conversely, test results indicating that your baby is at low risk for the tested condition does not rule out that the baby could still be affected  by that condition  First Trimester of Pregnancy The first trimester of pregnancy is from week 1 until the end of week 13 (months 1 through 3). During this time, your baby will begin to develop inside you. At 6-8 weeks, the eyes and face are formed, and the heartbeat can be seen on ultrasound. At the end of 12 weeks, all the baby's organs are formed. Prenatal care is all the medical care you receive before the birth of your baby. Make sure you get good prenatal care and follow all of your doctor's instructions. Follow these instructions at home: Medicines  Take over-the-counter and prescription medicines only as told by your doctor. Some medicines are safe and some medicines are not safe during pregnancy.  Take a prenatal vitamin that contains at least 600 micrograms (mcg) of folic acid.  If you have trouble pooping (constipation), take medicine that will make your stool soft (stool softener) if your doctor approves. Eating and drinking  Eat regular, healthy meals.  Your doctor will tell you the amount of weight gain that is right for you.  Avoid raw meat and uncooked cheese.  If you feel sick to your stomach (nauseous) or throw up (vomit): ? Eat 4 or 5 small meals a day instead of 3 large meals. ? Try eating a few soda crackers. ? Drink liquids between meals instead of during meals.  To prevent constipation: ? Eat foods that are high in fiber, like fresh fruits and vegetables, whole grains, and beans. ? Drink enough fluids to keep your pee (urine) clear or pale yellow. Activity  Exercise only as told by your doctor. Stop exercising if you have cramps or pain in your lower belly (abdomen) or low back.  Do not exercise if it is too hot, too humid, or if you are in a place of great height (high altitude).  Try to avoid standing for long periods of time. Move your legs often if you must stand in one place for a long time.  Avoid heavy lifting.  Wear low-heeled shoes. Sit and stand  up straight.  You can have sex unless your doctor tells you not to. Relieving pain and discomfort  Wear a good support bra if your breasts are sore.  Take warm water baths (sitz baths) to soothe pain or discomfort caused by hemorrhoids. Use hemorrhoid cream if your doctor says it is okay.  Rest with your legs raised if you have leg cramps or low back pain.  If you have puffy, bulging veins (varicose veins) in your legs: ? Wear support hose or compression stockings as told by your doctor. ? Raise (elevate) your feet for 15  minutes, 3-4 times a day. ? Limit salt in your food. Prenatal care  Schedule your prenatal visits by the twelfth week of pregnancy.  Write down your questions. Take them to your prenatal visits.  Keep all your prenatal visits as told by your doctor. This is important. Safety  Wear your seat belt at all times when driving.  Make a list of emergency phone numbers. The list should include numbers for family, friends, the hospital, and police and fire departments. General instructions  Ask your doctor for a referral to a local prenatal class. Begin classes no later than at the start of month 6 of your pregnancy.  Ask for help if you need counseling or if you need help with nutrition. Your doctor can give you advice or tell you where to go for help.  Do not use hot tubs, steam rooms, or saunas.  Do not douche or use tampons or scented sanitary pads.  Do not cross your legs for long periods of time.  Avoid all herbs and alcohol. Avoid drugs that are not approved by your doctor.  Do not use any tobacco products, including cigarettes, chewing tobacco, and electronic cigarettes. If you need help quitting, ask your doctor. You may get counseling or other support to help you quit.  Avoid cat litter boxes and soil used by cats. These carry germs that can cause birth defects in the baby and can cause a loss of your baby (miscarriage) or stillbirth.  Visit your  dentist. At home, brush your teeth with a soft toothbrush. Be gentle when you floss. Contact a doctor if:  You are dizzy.  You have mild cramps or pressure in your lower belly.  You have a nagging pain in your belly area.  You continue to feel sick to your stomach, you throw up, or you have watery poop (diarrhea).  You have a bad smelling fluid coming from your vagina.  You have pain when you pee (urinate).  You have increased puffiness (swelling) in your face, hands, legs, or ankles. Get help right away if:  You have a fever.  You are leaking fluid from your vagina.  You have spotting or bleeding from your vagina.  You have very bad belly cramping or pain.  You gain or lose weight rapidly.  You throw up blood. It may look like coffee grounds.  You are around people who have [place] measles, fifth disease, or chickenpox.  You have a very bad headache.  You have shortness of breath.  You have any kind of trauma, such as from a fall or a car accident. Summary  The first trimester of pregnancy is from week 1 until the end of week 13 (months 1 through 3).  To take care of yourself and your unborn baby, you will need to eat healthy meals, take medicines only if your doctor tells you to do so, and do activities that are safe for you and your baby.  Keep all follow-up visits as told by your doctor. This is important as your doctor will have to ensure that your baby is healthy and growing well. This information is not intended to replace advice given to you by your health care provider. Make sure you discuss any questions you have with your health care provider. Document Released: 01/02/2008 Document Revised: 07/24/2016 Document Reviewed: 07/24/2016 [employer]vier Interactive Patient Education  2017 [employer]

## 2017-06-15 LAB — RPR+RH+ABO+RUB AB+AB SCR+CB...
ANTIBODY SCREEN: NEGATIVE
HEP B S AG: NEGATIVE
HIV Screen 4th Generation wRfx: NONREACTIVE
Hematocrit: 34.3 % (ref 34.0–46.6)
Hemoglobin: 10.8 g/dL — ABNORMAL LOW (ref 11.1–15.9)
MCH: 26.2 pg — ABNORMAL LOW (ref 26.6–33.0)
MCHC: 31.5 g/dL (ref 31.5–35.7)
MCV: 83 fL (ref 79–97)
Platelets: 296 10*3/uL (ref 150–379)
RBC: 4.13 x10E6/uL (ref 3.77–5.28)
RDW: 16.1 % — AB (ref 12.3–15.4)
RH TYPE: POSITIVE
RPR: NONREACTIVE
RUBELLA: 3.84 {index} (ref >0.99)
Varicella zoster IgG: 558 index (ref >165)
WBC: 8.8 10*3/uL (ref 3.4–10.8)

## 2017-06-15 LAB — HEMOGLOBIN A1C
Est. average glucose Bld gHb Est-mCnc: 108 mg/dL
HEMOGLOBIN A1C: 5.4 % (ref 4.8–5.6)

## 2017-06-15 LAB — COMPREHENSIVE METABOLIC PANEL
A/G RATIO: 1.5 (ref 1.2–2.2)
ALT: 13 IU/L (ref 0–32)
AST: 15 IU/L (ref 0–40)
Albumin: 3.8 g/dL (ref 3.5–5.5)
Alkaline Phosphatase: 59 IU/L (ref 39–117)
BUN/Creatinine Ratio: 13 (ref 9–23)
BUN: 10 mg/dL (ref 6–20)
Bilirubin Total: <0.2 mg/dL (ref 0.0–1.2)
CO2: 22 mmol/L (ref 20–29)
Calcium: 8.5 mg/dL — ABNORMAL LOW (ref 8.7–10.2)
Chloride: 106 mmol/L (ref 96–106)
Creatinine, Ser: 0.78 mg/dL (ref 0.57–1.00)
GFR, EST AFRICAN AMERICAN: 112 mL/min/{1.73_m2} (ref >59)
GFR, EST NON AFRICAN AMERICAN: 97 mL/min/{1.73_m2} (ref >59)
GLOBULIN, TOTAL: 2.5 g/dL (ref 1.5–4.5)
Glucose: 93 mg/dL (ref 65–99)
POTASSIUM: 4.3 mmol/L (ref 3.5–5.2)
SODIUM: 140 mmol/L (ref 134–144)
TOTAL PROTEIN: 6.3 g/dL (ref 6.0–8.5)

## 2017-06-16 LAB — URINE CULTURE

## 2017-06-16 LAB — PROTEIN / CREATININE RATIO, URINE
Creatinine, Urine: 86.1 mg/dL
Protein, Ur: 8.6 mg/dL
Protein/Creat Ratio: 100 mg/g creat (ref 0–200)

## 2017-06-18 ENCOUNTER — Encounter: Payer: Self-pay | Admitting: Obstetrics and Gynecology

## 2017-06-18 ENCOUNTER — Other Ambulatory Visit: Payer: Self-pay | Admitting: Obstetrics and Gynecology

## 2017-06-18 DIAGNOSIS — O99019 Anemia complicating pregnancy, unspecified trimester: Secondary | ICD-10-CM | POA: Insufficient documentation

## 2017-06-18 DIAGNOSIS — A5609 Other chlamydial infection of lower genitourinary tract: Secondary | ICD-10-CM | POA: Insufficient documentation

## 2017-06-18 LAB — PAPIG, CTNGTV, HPV, RFX 16/18
CHLAMYDIA, NUC. ACID AMP: POSITIVE — AB
GONOCOCCUS, NUC. ACID AMP: NEGATIVE
HPV, high-risk: NEGATIVE
PAP Smear Comment: 0
Trich vag by NAA: NEGATIVE

## 2017-06-18 MED ORDER — AZITHROMYCIN 500 MG PO TABS: 1000.0000 mg | ORAL_TABLET | Freq: Once | ORAL | 0 refills | Status: AC

## 2017-06-24 ENCOUNTER — Ambulatory Visit (INDEPENDENT_AMBULATORY_CARE_PROVIDER_SITE_OTHER): Payer: 59

## 2017-06-24 ENCOUNTER — Other Ambulatory Visit: Payer: Self-pay | Admitting: Obstetrics and Gynecology

## 2017-06-24 ENCOUNTER — Ambulatory Visit (INDEPENDENT_AMBULATORY_CARE_PROVIDER_SITE_OTHER): Payer: 59 | Admitting: Obstetrics and Gynecology

## 2017-06-24 ENCOUNTER — Encounter: Payer: Self-pay | Admitting: Obstetrics and Gynecology

## 2017-06-24 VITALS — BP 134/88 | Wt 265.0 lb

## 2017-06-24 DIAGNOSIS — O09529 Supervision of elderly multigravida, unspecified trimester: Secondary | ICD-10-CM

## 2017-06-24 DIAGNOSIS — O169 Unspecified maternal hypertension, unspecified trimester: Secondary | ICD-10-CM

## 2017-06-24 DIAGNOSIS — O0990 Supervision of high risk pregnancy, unspecified, unspecified trimester: Secondary | ICD-10-CM

## 2017-06-24 DIAGNOSIS — Z3A01 Less than 8 weeks gestation of pregnancy: Secondary | ICD-10-CM | POA: Diagnosis not present

## 2017-06-24 DIAGNOSIS — O99210 Obesity complicating pregnancy, unspecified trimester: Secondary | ICD-10-CM

## 2017-06-24 DIAGNOSIS — O99019 Anemia complicating pregnancy, unspecified trimester: Secondary | ICD-10-CM

## 2017-06-24 DIAGNOSIS — A5609 Other chlamydial infection of lower genitourinary tract: Secondary | ICD-10-CM

## 2017-06-24 NOTE — Progress Notes (Signed)
Routine Prenatal Care Visit  Subjective  Kim Haley is a 36 y.o. G2P1001 at 7042w5d being seen today for ongoing prenatal care.  She is currently monitored for the following issues for this high-risk pregnancy and has [redacted] weeks gestation of pregnancy; Maternal obesity, antepartum; Antepartum multigravida of advanced maternal age; Elevated blood pressure affecting pregnancy, antepartum; Supervision of high risk pregnancy, antepartum; Anemia of mother in pregnancy, antepartum; and Chlamydial cervicitis on their problem list.  ----------------------------------------------------------------------------------- Patient reports no complaints.   Contractions: Not present. Vag. Bleeding: None.   . Denies leaking of fluid.  U/S confirms EDD, small fibroid noted ----------------------------------------------------------------------------------- The following portions of the patient's history were reviewed and updated as appropriate: allergies, current medications, past family history, past medical history, past social history, past surgical history and problem list. Problem list updated.  Objective  Blood pressure 134/88, weight 265 lb (120.2 kg), last menstrual period 05/08/2017. Pregravid weight 265 lb (120.2 kg) Total Weight Gain 0 lb (0 kg) Urinalysis:      Fetal Status: Fetal Heart Rate (bpm): Present         General:  Alert, oriented and cooperative. Patient is in no acute distress.  Skin: Skin is warm and dry. No rash noted.   Cardiovascular: Normal heart rate noted  Respiratory: Normal respiratory effort, no problems with respiration noted  Abdomen: Soft, gravid, appropriate for gestational age. Pain/Pressure: Absent     Pelvic:  Cervical exam deferred        Extremities: Normal range of motion.     Mental Status: Normal mood and affect. Normal behavior. Normal judgment and thought content.   Assessment   36 y.o. G2P1001 at 2442w5d by  02/12/2018, by Last Menstrual Period presenting for  routine prenatal visit  Plan   Pregancy # 2 Problems (from 05/08/17 to present)    Problem Noted Resolved   Anemia of mother in pregnancy, antepartum 06/18/2017 by [name], Andreas, MD No   Chlamydial cervicitis 06/18/2017 by [name], Andreas, MD No   Overview Signed 06/18/2017  2:13 PM by [name], Andreas, MD    Pap positive 06/18/2017 at [facility]     [redacted] weeks gestation of pregnancy 06/14/2017 by [name], Andreas, MD No   Maternal obesity, antepartum 06/14/2017 by [name], Andreas, MD No   Overview Signed 06/14/2017 10:08 AM by [name], Andreas, MD    BMI >=40 [x]  early 1h gtt - hgb A1C at [facility] - nml hgb A1c [X]  u/s for dating [ ]   [x]  folic acid 1mg  [ ]  bASA (>12 weeks) - discussed 06/14/17 [ ]  consider maternal EKG 1st trimester [ ]  Growth u/s 28 [ ] , 32 [ ] , 36 weeks [ ]  [ ]  NST/AFI weekly 36+ weeks (36[] , 37[] , 38[] , 39[] , 40[] ) [ ]  IOL by 41 weeks (scheduled, prn [] )      Antepartum multigravida of advanced maternal age 66/16/2018 by [name], Andreas, MD No   Elevated blood pressure affecting pregnancy, antepartum 06/14/2017 by [name], Andreas, MD No   Overview Addendum 06/18/2017 12:16 PM by [name], Andreas, MD    No history of CHTN baseline labs obtained 06/14/17  Baseline and surveillance labs [facility], refresh links as needed)  Lab Results  Component Value Date   PLT 296 06/14/2017   CREATININE 0.78 06/14/2017   AST 15 06/14/2017   ALT 13 06/14/2017  P/C ratio 100     Supervision of high risk pregnancy, antepartum 06/14/2017 by [name], Andreas, MD No   Overview Addendum 06/18/2017  2:14 PM by Bonney AidStaebler,  Carmel SacramentoAndreas, MD    Clinic [facility] Prenatal Labs  Dating  Blood type: A pos  Genetic Screen  NIPS: Antibody:negative  Anatomic US  Rubella: Immune Varicella: Immune  GTT Early: HgbA1C 5.4              Third trimester:  RPR: NR  Rhogam  HBsAg: Negative  TDaP vaccine                       Flu Shot: HIV: Negative  Baby Food                                 GBS:   Contraception  Pap: NIL HPV negative  CBB   Chalmydia positive 06/14/2017  CS/VBAC    Support Person            Please refer to After Visit Summary for other counseling recommendations.   Return in about 4 weeks (around 07/22/2017) for schedule lab appt for MaterniT21 and Routine Prenatal Appointment.  Thomasene MohairStephen Amneet Cendejas, MD  06/24/2017 3:43 PM

## 2017-07-25 ENCOUNTER — Encounter: Payer: Self-pay | Admitting: Obstetrics and Gynecology

## 2017-07-25 ENCOUNTER — Ambulatory Visit (INDEPENDENT_AMBULATORY_CARE_PROVIDER_SITE_OTHER): Payer: 59 | Admitting: Obstetrics and Gynecology

## 2017-07-25 VITALS — BP 136/92 | Wt 272.0 lb

## 2017-07-25 DIAGNOSIS — O09521 Supervision of elderly multigravida, first trimester: Secondary | ICD-10-CM

## 2017-07-25 DIAGNOSIS — Z3A11 11 weeks gestation of pregnancy: Secondary | ICD-10-CM

## 2017-07-25 DIAGNOSIS — A749 Chlamydial infection, unspecified: Secondary | ICD-10-CM | POA: Diagnosis not present

## 2017-07-25 DIAGNOSIS — O09529 Supervision of elderly multigravida, unspecified trimester: Secondary | ICD-10-CM

## 2017-07-25 DIAGNOSIS — O0990 Supervision of high risk pregnancy, unspecified, unspecified trimester: Secondary | ICD-10-CM

## 2017-07-25 DIAGNOSIS — O99210 Obesity complicating pregnancy, unspecified trimester: Secondary | ICD-10-CM

## 2017-07-25 DIAGNOSIS — O169 Unspecified maternal hypertension, unspecified trimester: Secondary | ICD-10-CM

## 2017-07-25 NOTE — Progress Notes (Signed)
Routine Prenatal Care Visit  Subjective  Kim Haley is a 36 y.o. G2P1001 at 538w1d being seen today for ongoing prenatal care.  She is currently monitored for the following issues for this high-risk pregnancy and has [redacted] weeks gestation of pregnancy; Maternal obesity, antepartum; Antepartum multigravida of advanced maternal age; Elevated blood pressure affecting pregnancy, antepartum; Supervision of high risk pregnancy, antepartum; Anemia of mother in pregnancy, antepartum; and Chlamydial cervicitis on their problem list.  ----------------------------------------------------------------------------------- Patient reports no complaints.  Bedside US showed fetus with movement and cardiac activity.  Contractions: Not present. Vag. Bleeding: None.   . Denies leaking of fluid.  ----------------------------------------------------------------------------------- The following portions of the patient's history were reviewed and updated as appropriate: allergies, current medications, past family history, past medical history, past social history, past surgical history and problem list. Problem list updated.   Objective  Blood pressure (!) 136/92, weight 272 lb (123.4 kg), last menstrual period 05/08/2017. Pregravid weight 265 lb (120.2 kg) Total Weight Gain 7 lb (3.175 kg) Urinalysis: Urine Protein: Negative Urine Glucose: Negative  Fetal Status: Fetal Heart Rate (bpm): present         General:  Alert, oriented and cooperative. Patient is in no acute distress.  Skin: Skin is warm and dry. No rash noted.   Cardiovascular: Normal heart rate noted  Respiratory: Normal respiratory effort, no problems with respiration noted  Abdomen: Soft, gravid, appropriate for gestational age. Pain/Pressure: Absent     Pelvic:  Cervical exam deferred        Extremities: Normal range of motion.     ental Status: Normal mood and affect. Normal behavior. Normal judgment and thought content.     Assessment    36 y.o. G2P1001 at 378w1d by  02/12/2018, by Last Menstrual Period presenting for routine prenatal visit  Plan   Pregancy # 2 Problems (from 05/08/17 to present)    Problem Noted Resolved   Anemia of mother in pregnancy, antepartum 06/18/2017 by [name], Andreas, MD No   Chlamydial cervicitis 06/18/2017 by [name], Andreas, MD No   Overview Signed 06/18/2017  2:13 PM by [name], Andreas, MD    Pap positive 06/18/2017 at [facility]      [redacted] weeks gestation of pregnancy 06/14/2017 by [name], Andreas, MD No   Maternal obesity, antepartum 06/14/2017 by [name], Andreas, MD No   Overview Addendum 07/25/2017 10:13 AM by Natale MilchSchuman, Christanna R, MD    BMI >=40 Arly.Keller[X ] early 1h gtt - hgb A1C at [facility] (5.4) [X]  u/s for dating [ ]   [X]  folic acid 1mg  Arly.Keller[X ] bASA (>12 weeks) - discussed 06/14/17 [ ]  consider maternal EKG 1st trimester [ ]  Growth u/s 28 [ ] , 32 [ ] , 36 weeks [ ]  [ ]  NST/AFI weekly 36+ weeks (36[] , 37[] , 38[] , 39[] , 40[] ) [ ]  IOL by 41 weeks (scheduled, prn [] )      Antepartum multigravida of advanced maternal age 20/16/2018 by [name], Andreas, MD No   Elevated blood pressure affecting pregnancy, antepartum 06/14/2017 by [name], Andreas, MD No   Overview Addendum 06/18/2017 12:16 PM by [name], Andreas, MD    No history of CHTN baseline labs obtained 06/14/17  Baseline and surveillance labs [facility], refresh links as needed)  Lab Results  Component Value Date   PLT 296 06/14/2017   CREATININE 0.78 06/14/2017   AST 15 06/14/2017   ALT 13 06/14/2017  P/C ratio 100         Supervision of high risk pregnancy, antepartum 06/14/2017  by [name], Andreas, MD No   Overview Addendum 07/25/2017 10:12 AM by Natale MilchSchuman, Christanna R, MD    Clinic [facility] Prenatal Labs  Dating  LMP=6wk US Blood type: A pos  Genetic Screen  NIPS: Antibody:negative  Anatomic US  Rubella: Immune Varicella: Immune  GTT Early: HgbA1C 5.4              Third trimester:  RPR: NR  Rhogam  HBsAg:  Negative  TDaP vaccine                        Flu Shot: 03/30/17 HIV: Negative  Baby Food                                GBS:   Contraception  Pap: NIL HPV negative  CBB   Chalmydia positive 06/14/2017  CS/VBAC    Support Person                Discussed with patient risks of CHTN and development of Preeclampsia/Eclampsia. Discussed weight gain goals of limiting weight gain to 10-15 lbs for entire pregnancy. Encourage patient to initiate walking for 30-60 minutes every night. Encouraged patient to start taking nightly baby aspirin.   Preterm labor symptoms and general obstetric precautions including but not limited to vaginal bleeding, contractions, leaking of fluid and fetal movement were reviewed in detail with the patient. Please refer to After Visit Summary for other counseling recommendations.   Return in about 4 weeks (around 08/22/2017).

## 2017-07-27 LAB — GC/CHLAMYDIA PROBE AMP
CHLAMYDIA, DNA PROBE: NEGATIVE
Neisseria gonorrhoeae by PCR: NEGATIVE

## 2017-07-30 NOTE — L&D Delivery Note (Signed)
Patient is 37 y.o. G2P1001 3631w1d admitted for IOL for cHTN. S/p IOL with foley bulb, cytotec, followed by Pitocin. AROM at [phone]. Prenatal course also complicated by obesity.  Delivery Note At 12:28 PM a viable female was delivered via Vaginal, Spontaneous (Presentation: Direct OA ).  APGAR: 6, 8; weight pending skin to skin.  Placenta status: Intact.  Cord: 3V with the following complications: None.  Cord pH:   Anesthesia:  Epidural Episiotomy: None Lacerations: 2nd degree Suture Repair: 3.0 vicryl Est. Blood Loss (mL): 800  Head delivered. No nuchal cord present. Shoulder with difficulty delivering due to poor maternal effort. Shoulder dystocia called. Dystocia lasted for 45s and resolved with the following maneuvers. First maneuver: McRoberts. Second maneuver: suprapubic pressure: Third maneuver: releasing of posterior shoulder then rotation. Shoulder dystocia resolved after this and body delivered in usual fashion. Infant stunned initially so cord was clamped and cut and infant stimulated at warmer. Cord blood drawn. Placenta delivered spontaneously with gentle cord traction. Fundus firm with massage and Pitocin.   Mom to postpartum.  Baby to Couplet care / Skin to Skin.  Caryl AdaJazma Dorcus Riga, DO 02/06/2018, 1:05 PM OB Fellow [facility], [facility]

## 2017-07-31 LAB — MATERNIT 21 PLUS CORE, BLOOD
CHROMOSOME 13: NEGATIVE
CHROMOSOME 18: NEGATIVE
CHROMOSOME 21: NEGATIVE
Y CHROMOSOME: NOT DETECTED

## 2017-08-01 NOTE — Progress Notes (Signed)
Called and discussed with patient. Normal XX pregnancy. Gc/CT negative.

## 2017-08-22 ENCOUNTER — Ambulatory Visit (INDEPENDENT_AMBULATORY_CARE_PROVIDER_SITE_OTHER): Payer: 59 | Admitting: Obstetrics & Gynecology

## 2017-08-22 VITALS — BP 130/80 | Wt 273.0 lb

## 2017-08-22 DIAGNOSIS — O09529 Supervision of elderly multigravida, unspecified trimester: Secondary | ICD-10-CM

## 2017-08-22 DIAGNOSIS — O0990 Supervision of high risk pregnancy, unspecified, unspecified trimester: Secondary | ICD-10-CM

## 2017-08-22 DIAGNOSIS — Z3A15 15 weeks gestation of pregnancy: Secondary | ICD-10-CM

## 2017-08-22 DIAGNOSIS — O99212 Obesity complicating pregnancy, second trimester: Secondary | ICD-10-CM

## 2017-08-22 NOTE — Progress Notes (Signed)
  Subjective  Fetal Movement? no Contractions? no Leaking Fluid? no Vaginal Bleeding? no  Objective  BP 130/80   Wt 273 lb (123.8 kg)   LMP 05/08/2017   BMI 44.74 kg/m  General: NAD Pumonary: no increased work of breathing Abdomen: gravid, non-tender Extremities: no edema Psychiatric: mood appropriate, affect full  Assessment  37 y.o. G2P1001 at 5969w1d by  02/12/2018, by Last Menstrual Period presenting for routine prenatal visit  Plan   Problem List Items Addressed This Visit      Other   Obesity affecting pregnancy in second trimester   Antepartum multigravida of advanced maternal age   Supervision of high risk pregnancy, antepartum    Other Visit Diagnoses    [redacted] weeks gestation of pregnancy    -  Primary   Relevant Orders   US OB Comp + 14 Wk    BMI >=40 [x ] early 1h gtt - HgbA1C 5.4 [ x] u/s for dating   [ x] nutritional goals [ x] folic acid 1mg  [ x] bASA (>12 weeks)  Starting today [  ] consider nutrition consult [  ] consider maternal EKG 1st trimester [ x] Growth u/s 28 [ ] , 32 [ ] , 36 weeks [ ]  [ x] NST/AFI weekly 36+ weeks (36[] , 37[] , 38[] , 39[] , 40[] ) [ x] IOL by 41 weeks (scheduled, prn [] )  Annamarie MajorPaul Voula Waln, MD, Merlinda FrederickFACOG [facility], [facility] 08/22/2017  8:39 AM

## 2017-08-22 NOTE — Patient Instructions (Signed)

## 2017-09-16 ENCOUNTER — Ambulatory Visit (INDEPENDENT_AMBULATORY_CARE_PROVIDER_SITE_OTHER): Payer: 59

## 2017-09-16 ENCOUNTER — Ambulatory Visit (INDEPENDENT_AMBULATORY_CARE_PROVIDER_SITE_OTHER): Payer: 59 | Admitting: Obstetrics and Gynecology

## 2017-09-16 VITALS — BP 124/76 | Wt 281.0 lb

## 2017-09-16 DIAGNOSIS — R030 Elevated blood-pressure reading, without diagnosis of hypertension: Secondary | ICD-10-CM | POA: Diagnosis not present

## 2017-09-16 DIAGNOSIS — O09522 Supervision of elderly multigravida, second trimester: Secondary | ICD-10-CM | POA: Diagnosis not present

## 2017-09-16 DIAGNOSIS — O09893 Supervision of other high risk pregnancies, third trimester: Secondary | ICD-10-CM

## 2017-09-16 DIAGNOSIS — D649 Anemia, unspecified: Secondary | ICD-10-CM | POA: Diagnosis not present

## 2017-09-16 DIAGNOSIS — Z3A01 Less than 8 weeks gestation of pregnancy: Secondary | ICD-10-CM | POA: Diagnosis not present

## 2017-09-16 DIAGNOSIS — Z3A15 15 weeks gestation of pregnancy: Secondary | ICD-10-CM | POA: Diagnosis not present

## 2017-09-16 DIAGNOSIS — O99012 Anemia complicating pregnancy, second trimester: Secondary | ICD-10-CM

## 2017-09-16 DIAGNOSIS — O99212 Obesity complicating pregnancy, second trimester: Secondary | ICD-10-CM

## 2017-09-16 DIAGNOSIS — Z3A18 18 weeks gestation of pregnancy: Secondary | ICD-10-CM

## 2017-09-16 DIAGNOSIS — O09529 Supervision of elderly multigravida, unspecified trimester: Secondary | ICD-10-CM

## 2017-09-16 DIAGNOSIS — O0990 Supervision of high risk pregnancy, unspecified, unspecified trimester: Secondary | ICD-10-CM

## 2017-09-16 NOTE — Progress Notes (Signed)
Routine Prenatal Care Visit  Subjective  Kim Haley is a 37 y.o. G2P1001 at 5331w5d being seen today for ongoing prenatal care.  She is currently monitored for the following issues for this high-risk pregnancy and has Obesity affecting pregnancy in second trimester; Antepartum multigravida of advanced maternal age; Elevated blood pressure affecting pregnancy, antepartum; Supervision of high risk pregnancy, antepartum; Anemia of mother in pregnancy, antepartum; and Chlamydial cervicitis on their problem list.  ----------------------------------------------------------------------------------- Anatomy scan today. It's a GIRL! Pt states she had diarrhea on Saturday and had some cramping that day as well.  Contractions: Not present. Vag. Bleeding: None.  Movement: Present. Denies leaking of fluid.  ----------------------------------------------------------------------------------- The following portions of the patient's history were reviewed and updated as appropriate: allergies, current medications, past family history, past medical history, past social history, past surgical history and problem list. Problem list updated.   Objective  Blood pressure 124/76, weight 281 lb (127.5 kg), last menstrual period 05/08/2017. Pregravid weight 265 lb (120.2 kg) Total Weight Gain 16 lb (7.258 kg) Urinalysis: Urine Protein: Negative Urine Glucose: Negative  Fetal Status: Fetal Heart Rate (bpm): 172   Movement: Present     General:  Alert, oriented and cooperative. Patient is in no acute distress.  Skin: Skin is warm and dry. No rash noted.   Cardiovascular: Normal heart rate noted  Respiratory: Normal respiratory effort, no problems with respiration noted  Abdomen: Soft, gravid, appropriate for gestational age. Pain/Pressure: Absent     Pelvic:  Cervical exam deferred        Extremities: Normal range of motion.     ental Status: Normal mood and affect. Normal behavior. Normal judgment and thought  content.     Assessment   37 y.o. G2P1001 at 5131w5d by  02/12/2018, by Last Menstrual Period presenting for routine prenatal visit  Plan   Pregancy # 2 Problems (from 05/08/17 to present)    Problem Noted Resolved   Anemia of mother in pregnancy, antepartum 06/18/2017 by [name], Andreas, MD No   Chlamydial cervicitis 06/18/2017 by [name], Andreas, MD No   Overview Signed 06/18/2017  2:13 PM by [name], Andreas, MD    Pap positive 06/18/2017 at [facility]      Obesity affecting pregnancy in second trimester 06/14/2017 by [name], Andreas, MD No   Overview Addendum 07/25/2017 10:13 AM by Kim MilchSchuman, Christanna R, MD    BMI >=40 Arly.Keller[X ] early 1h gtt - hgb A1C at NOB (5.4) [X]  u/s for dating [ ]   [X]  folic acid 1mg  Arly.Keller[X ] bASA (>12 weeks) - discussed 06/14/17 [ ]  consider maternal EKG 1st trimester [ ]  Growth u/s 28 [ ] , 32 [ ] , 36 weeks [ ]  [ ]  NST/AFI weekly 36+ weeks (36[] , 37[] , 38[] , 39[] , 40[] ) [ ]  IOL by 41 weeks (scheduled, prn [] )      Antepartum multigravida of advanced maternal age 25/16/2018 by [name], Andreas, MD No   Elevated blood pressure affecting pregnancy, antepartum 06/14/2017 by [name], Andreas, MD No   Overview Addendum 06/18/2017 12:16 PM by [name], Andreas, MD    No history of CHTN baseline labs obtained 06/14/17  Baseline and surveillance labs ([facility], refresh links as needed)  Lab Results  Component Value Date   PLT 296 06/14/2017   CREATININE 0.78 06/14/2017   AST 15 06/14/2017   ALT 13 06/14/2017  P/C ratio 100         Supervision of high risk pregnancy, antepartum 06/14/2017 by [name], Andreas, MD No  Overview Addendum 09/16/2017 11:16 AM by Kim Milch, MD    Clinic [facility] Prenatal Labs  Dating  LMP=6wk [place] Blood type: A pos  Genetic Screen  NIPS: Normal XX Antibody:negative  Anatomic [place]  Incomplete Rubella: Immune Varicella: Immune  GTT Early: HgbA1C 5.4              Third trimester:  RPR: NR  Rhogam  not needed  HBsAg: Negative  TDaP vaccine                        Flu Shot: 03/30/17 HIV: Negative  Baby Food  Breast                              GBS:   Contraception  Undecided Pap: NIL HPV negative  CBB   Given Information Chalmydia positive 06/14/2017  CS/VBAC  Not applicable   Support Person            [redacted] weeks gestation of pregnancy 06/14/2017 by Vena Austria, MD 08/22/2017 by Kim Mustard, MD       Gestational age appropriate obstetric precautions including but not limited to vaginal bleeding, contractions, leaking of fluid and fetal movement were reviewed in detail with the patient.    Anatomy US incomplete- follow up in 2 weeks Given information about breast feeding Given information about cord blood banking Given information on birth control options 15lb weight gain, patient says she is craving root beer and twizzlers. Will refer to nutrition for counseling and goal setting. 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    Return in about 2 weeks (around 09/30/2017) for ROB and [place].  Christanna Laural [facility], [facility] 09/16/2017, 11:20 AM

## 2017-09-19 ENCOUNTER — Other Ambulatory Visit: Payer: 59

## 2017-09-19 ENCOUNTER — Encounter: Payer: 59 | Admitting: Obstetrics and Gynecology

## 2017-09-30 ENCOUNTER — Ambulatory Visit (INDEPENDENT_AMBULATORY_CARE_PROVIDER_SITE_OTHER): Payer: 59 | Admitting: Obstetrics and Gynecology

## 2017-09-30 ENCOUNTER — Encounter: Payer: 59 | Admitting: Obstetrics and Gynecology

## 2017-09-30 ENCOUNTER — Ambulatory Visit (INDEPENDENT_AMBULATORY_CARE_PROVIDER_SITE_OTHER): Payer: 59

## 2017-09-30 ENCOUNTER — Encounter: Payer: Self-pay | Admitting: Obstetrics and Gynecology

## 2017-09-30 VITALS — BP 130/70 | Wt 284.0 lb

## 2017-09-30 DIAGNOSIS — Z3A22 22 weeks gestation of pregnancy: Secondary | ICD-10-CM

## 2017-09-30 DIAGNOSIS — O99212 Obesity complicating pregnancy, second trimester: Secondary | ICD-10-CM

## 2017-09-30 DIAGNOSIS — O0990 Supervision of high risk pregnancy, unspecified, unspecified trimester: Secondary | ICD-10-CM

## 2017-09-30 DIAGNOSIS — O169 Unspecified maternal hypertension, unspecified trimester: Secondary | ICD-10-CM

## 2017-09-30 DIAGNOSIS — O99019 Anemia complicating pregnancy, unspecified trimester: Secondary | ICD-10-CM

## 2017-09-30 DIAGNOSIS — O09529 Supervision of elderly multigravida, unspecified trimester: Secondary | ICD-10-CM

## 2017-09-30 NOTE — Progress Notes (Signed)
Routine Prenatal Care Visit  Subjective  Kim Haley is a 37 y.o. G2P1001 at 3116w1d being seen today for ongoing prenatal care.  She is currently monitored for the following issues for this high-risk pregnancy and has Obesity affecting pregnancy in second trimester; Antepartum multigravida of advanced maternal age; Elevated blood pressure affecting pregnancy, antepartum; Supervision of high risk pregnancy, antepartum; Anemia of mother in pregnancy, antepartum; and Chlamydial cervicitis on their problem list.  ----------------------------------------------------------------------------------- Patient reports no complaints.   Contractions: Not present. Vag. Bleeding: None.  Movement: Present. Denies leaking of fluid.  ----------------------------------------------------------------------------------- The following portions of the patient's history were reviewed and updated as appropriate: allergies, current medications, past family history, past medical history, past social history, past surgical history and problem list. Problem list updated.   Objective  Blood pressure 130/70, weight 284 lb (128.8 kg), last menstrual period 05/08/2017. Pregravid weight 265 lb (120.2 kg) Total Weight Gain 19 lb (8.618 kg) Urinalysis: Urine Protein: Negative Urine Glucose: Negative  Fetal Status: Fetal Heart Rate (bpm): 145   Movement: Present     General:  Alert, oriented and cooperative. Patient is in no acute distress.  Skin: Skin is warm and dry. No rash noted.   Cardiovascular: Normal heart rate noted  Respiratory: Normal respiratory effort, no problems with respiration noted  Abdomen: Soft, gravid, appropriate for gestational age. Pain/Pressure: Absent     Pelvic:  Cervical exam deferred        Extremities: Normal range of motion.     ental Status: Normal mood and affect. Normal behavior. Normal judgment and thought content.     Assessment   37 y.o. G2P1001 at 4316w1d by  02/12/2018, by Last  Menstrual Period presenting for routine prenatal visit  Plan   Pregancy # 2 Problems (from 05/08/17 to present)    Problem Noted Resolved   Anemia of mother in pregnancy, antepartum 06/18/2017 by [name], Andreas, MD No   Chlamydial cervicitis 06/18/2017 by [name], Andreas, MD No   Overview Signed 06/18/2017  2:13 PM by [name], Andreas, MD    Pap positive 06/18/2017 at [facility]B      Obesity affecting pregnancy in second trimester 06/14/2017 by [name], Andreas, MD No   Overview Addendum 07/25/2017 10:13 AM by Natale MilchSchuman, Christanna R, MD    BMI >=40 Arly.Keller[X ] early 1h gtt - hgb A1C at NOB (5.4) [X]  u/s for dating [ ]   [X]  folic acid 1mg  Arly.Keller[X ] bASA (>12 weeks) - discussed 06/14/17 [ ]  consider maternal EKG 1st trimester [ ]  Growth u/s 28 [ ] , 32 [ ] , 36 weeks [ ]  [ ]  NST/AFI weekly 36+ weeks (36[] , 37[] , 38[] , 39[] , 40[] ) [ ]  IOL by 41 weeks (scheduled, prn [] )      Antepartum multigravida of advanced maternal age 58/16/2018 by [name], Andreas, MD No   Elevated blood pressure affecting pregnancy, antepartum 06/14/2017 by [name], Andreas, MD No   Overview Addendum 06/18/2017 12:16 PM by [name], Andreas, MD    No history of CHTN baseline labs obtained 06/14/17  Baseline and surveillance labs ([facility], refresh links as needed)  Lab Results  Component Value Date   PLT 296 06/14/2017   CREATININE 0.78 06/14/2017   AST 15 06/14/2017   ALT 13 06/14/2017  P/C ratio 100         Supervision of high risk pregnancy, antepartum 06/14/2017 by [name], Andreas, MD No   Overview Addendum 09/30/2017 12:27 PM by Natale MilchSchuman, Christanna R, MD    [facility]  [facility] Labs  Dating  LMP=6wk [place] Blood type: A pos  Genetic Screen  NIPS: Normal XX Antibody:negative  Anatomic [place]  Incomplete Rubella: Immune Varicella: Immune  GTT Early: HgbA1C 5.4              Third trimester:  RPR: NR  Rhogam  not needed HBsAg: Negative  TDaP vaccine                        Flu Shot: 03/30/17 HIV:  Negative  Baby Food  Breast                              GBS:   Contraception  Undecided Pap: NIL HPV negative  CBB   Yes Chalmydia positive 06/14/2017  CS/VBAC  Not applicable   Support Person                     Gestational age appropriate obstetric precautions including but not limited to vaginal bleeding, contractions, leaking of fluid and fetal movement were reviewed in detail with the patient.    Encouraged patient to limit weight gain. Patient has gained 20 lbs this pregnancy. Discussed that if her BMI was above 50 she would not be able to deliver at [facility]. Patient was referred to Nutrition already, but has not made an appointment.  Anatomy US today was normal.  Return in about 2 weeks (around 10/14/2017) for ROB.  Adelene Idler MD [facility], [facility] 10/03/2017, 3:19 PM

## 2017-10-03 ENCOUNTER — Encounter: Payer: Self-pay | Admitting: Obstetrics and Gynecology

## 2017-10-08 ENCOUNTER — Encounter: Payer: 59 | Attending: Obstetrics and Gynecology | Admitting: Dietician

## 2017-10-08 ENCOUNTER — Encounter: Payer: Self-pay | Admitting: Dietician

## 2017-10-08 VITALS — Wt 290.6 lb

## 2017-10-08 DIAGNOSIS — Z713 Dietary counseling and surveillance: Secondary | ICD-10-CM | POA: Diagnosis not present

## 2017-10-08 DIAGNOSIS — O99212 Obesity complicating pregnancy, second trimester: Secondary | ICD-10-CM | POA: Insufficient documentation

## 2017-10-08 DIAGNOSIS — O0990 Supervision of high risk pregnancy, unspecified, unspecified trimester: Secondary | ICD-10-CM | POA: Diagnosis not present

## 2017-10-08 NOTE — Patient Instructions (Signed)
Balance meals with 2-4 oz protein, 2-4 servings of carbohydrate, and non-starchy vegetables. Add non-starchy vegetables to sandwich and wrap meals. 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 Refer to menu suggestions. Limit the added fats such as mayonnaise, salad dressings and margarine.

## 2017-10-08 NOTE — Progress Notes (Signed)
Medical Nutrition Therapy: Visit start time:1515  end time: 1620  Assessment:  Diagnosis: obesity affecting pregnancy in 2nd trimester  Psychosocial issues/ stress concerns: Patient rates her stress as high. 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  Preferred learning method:  . Hands-on Current weight: 290.6 lbs (with shoes) Height: 66in Medications, supplements: see list Progress and evaluation:  Patient in for initial medical nutrition therapy appointment. She is [redacted] weeks gestation. She reports a pregravid weight of 249 lbs which would indicate a total weight gain of 41.6 lbs thus far. Based on weight at prenatal visit, 09/30/17, she has gained 6 lbs in the past week. She gives a typical pattern of 3 meals per day when working day shift and she states she does not eat when working the 12 hour night shift. She doesn't eat after getting off work at 7:00am and she eats a dinner meal after waking at 4:30 or 5:00pm She expressed a concern about wanting to prevent gestational diabetes. Her present diet is low in fruits, vegetables, whole grains and calcium sources.  Physical activity:  Walks 4 days per week for 30 minutes.  Dietary Intake:  Usual eating pattern varies but can include 3 meals and 1-2 snacks per day on day shift and 1 meal and 1-2 snacks when working night shift. Dining out frequency: 1 meal per week.  Breakfast: 5:30am- raisin bran/milkor [employer]'s oatmeal Snack: breakfast bar or goes to cafeteria and gets snack package of nuts/cheese/fruit and crackers Lunch: 1:30 chicken wrap with vegetables, Supper: chicken prepared in air fryer, salad, water Snack:banana Beverages: reports she drinks 1 gallon of water daily; reports no sugar sweetened beverages.  Nutrition Care Education:  Prenatal Nutrition/Weight control:  Instructed on a meal plan based on 2000 calories including  carbohydrate counting, portion control and how to better balance carbohydrate, protein and non-starchy vegetables. Discussed how this plan can help avoid excessive weight gain which can increase risk of gestational diabetes. Encouraged to establish a consistent meal pattern of meals/snacks for night as well as day shift.  Gave and reviewed sample menus.  Also, gave and reviewed food safety guidelines including prevention of listeriosis as well as avoidance of food borne illnesses.    Nutritional Diagnosis:  NI-1.5 Excessive energy intake As related to inconsisent pattern of meals and snacks.  As evidenced by diet history..  Intervention:  Balance meals with 2-4 oz protein, 2-4 servings of carbohydrate, and non-starchy vegetables. Add non-starchy vegetables to sandwich and wrap meals. 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 Refer to menu suggestions. Limit the added fats such as mayonnaise, salad dressings and margarine.  Education Materials given:  . Plate Planner . Food lists/ Planning A Balanced Meal . Sample meal pattern/ menus . Food Safety during pregnancy . Goals/ instructions  Learner/ who was taught:  . Patient  Level of understanding: Verbalized understanding Demonstrated degree of understanding via:   Teach back Learning barriers: . None Willingness to learn/ readiness for change: . Acceptance, ready for change  Monitoring and Evaluation:  Dietary intake, exercise,  and body weight      follow up: 11/07/2017 at 9:00am.

## 2017-10-14 ENCOUNTER — Encounter: Payer: Self-pay | Admitting: Obstetrics and Gynecology

## 2017-10-14 ENCOUNTER — Ambulatory Visit (INDEPENDENT_AMBULATORY_CARE_PROVIDER_SITE_OTHER): Payer: 59 | Admitting: Obstetrics and Gynecology

## 2017-10-14 VITALS — BP 126/80 | Wt 282.0 lb

## 2017-10-14 DIAGNOSIS — O09529 Supervision of elderly multigravida, unspecified trimester: Secondary | ICD-10-CM

## 2017-10-14 DIAGNOSIS — O99012 Anemia complicating pregnancy, second trimester: Secondary | ICD-10-CM | POA: Diagnosis not present

## 2017-10-14 DIAGNOSIS — O09522 Supervision of elderly multigravida, second trimester: Secondary | ICD-10-CM | POA: Diagnosis not present

## 2017-10-14 DIAGNOSIS — O99019 Anemia complicating pregnancy, unspecified trimester: Secondary | ICD-10-CM

## 2017-10-14 DIAGNOSIS — O0992 Supervision of high risk pregnancy, unspecified, second trimester: Secondary | ICD-10-CM | POA: Diagnosis not present

## 2017-10-14 DIAGNOSIS — Z3A22 22 weeks gestation of pregnancy: Secondary | ICD-10-CM

## 2017-10-14 DIAGNOSIS — O99212 Obesity complicating pregnancy, second trimester: Secondary | ICD-10-CM | POA: Diagnosis not present

## 2017-10-14 DIAGNOSIS — O0990 Supervision of high risk pregnancy, unspecified, unspecified trimester: Secondary | ICD-10-CM

## 2017-10-14 NOTE — Progress Notes (Signed)
Routine Prenatal Care Visit  Subjective  Kim Haley is a 37 y.o. G2P1001 at [redacted]w[redacted]d being seen today for ongoing prenatal care.  She is currently monitored for the following issues for this high-risk pregnancy and has Obesity affecting pregnancy in second trimester; Antepartum multigravida of advanced maternal age; Elevated blood pressure affecting pregnancy, antepartum; Supervision of high risk pregnancy, antepartum; Anemia of mother in pregnancy, antepartum; and Chlamydial cervicitis on their problem list.  ----------------------------------------------------------------------------------- Patient reports no complaints.   Contractions: Not present. Vag. Bleeding: None.  Movement: Present. Denies leaking of fluid.  ----------------------------------------------------------------------------------- The following portions of the patient's history were reviewed and updated as appropriate: allergies, current medications, past family history, past medical history, past social history, past surgical history and problem list. Problem list updated.   Objective  Blood pressure 126/80, weight 282 lb (127.9 kg), last menstrual period 05/08/2017. Pregravid weight 265 lb (120.2 kg) Total Weight Gain 17 lb (7.711 kg) Urinalysis: Urine Protein: Negative Urine Glucose: Negative  Fetal Status: Fetal Heart Rate (bpm): 145   Movement: Present     General:  Alert, oriented and cooperative. Patient is in no acute distress.  Skin: Skin is warm and dry. No rash noted.   Cardiovascular: Normal heart rate noted  Respiratory: Normal respiratory effort, no problems with respiration noted  Abdomen: Soft, gravid, appropriate for gestational age. Pain/Pressure: Absent     Pelvic:  Cervical exam deferred        Extremities: Normal range of motion.     ental Status: Normal mood and affect. Normal behavior. Normal judgment and thought content.     Assessment   37 y.o. G2P1001 at [redacted]w[redacted]d by  02/12/2018, by Last  Menstrual Period presenting for routine prenatal visit  Plan   Pregancy # 2 Problems (from 05/08/17 to present)    Problem Noted Resolved   Anemia of mother in pregnancy, antepartum 06/18/2017 by Vena Austria, MD No   Chlamydial cervicitis 06/18/2017 by Vena Austria, MD No   Overview Signed 06/18/2017  2:13 PM by Vena Austria, MD    Pap positive 06/18/2017 at [facility]      Obesity affecting pregnancy in second trimester 06/14/2017 by Vena Austria, MD No   Overview Addendum 07/25/2017 10:13 AM by Natale Milch, MD    BMI >=40 Arly.Keller ] early 1h gtt - hgb A1C at [facility] (5.4) [X]  u/s for dating [ ]   [X]  folic acid 1mg  Arly.Keller ] bASA (>12 weeks) - discussed 06/14/17 [ ]  consider maternal EKG 1st trimester [ ]  Growth u/s 28 [ ] , 32 [ ] , 36 weeks [ ]  [ ]  NST/AFI weekly 36+ weeks (36[] , 37[] , 38[] , 39[] , 40[] ) [ ]  IOL by 41 weeks (scheduled, prn [] )      Antepartum multigravida of advanced maternal age 84/16/2018 by Vena Austria, MD No   Elevated blood pressure affecting pregnancy, antepartum 06/14/2017 by Vena Austria, MD No   Overview Addendum 06/18/2017 12:16 PM by Vena Austria, MD    No history of CHTN baseline labs obtained 06/14/17  Baseline and surveillance labs (pulled in from [facility], refresh links as needed)  Lab Results  Component Value Date   PLT 296 06/14/2017   CREATININE 0.78 06/14/2017   AST 15 06/14/2017   ALT 13 06/14/2017  P/C ratio 100         Supervision of high risk pregnancy, antepartum 06/14/2017 by Vena Austria, MD No   Overview Addendum 09/30/2017 12:27 PM by Natale Milch, MD    Clinic [facility]  Prenatal Labs  Dating  LMP=6wk US Blood type: A pos  Genetic Screen  NIPS: Normal XX Antibody:negative  Anatomic US  Incomplete Rubella: Immune Varicella: Immune  GTT Early: HgbA1C 5.4              Third trimester:  RPR: NR  Rhogam  not needed HBsAg: Negative  TDaP vaccine                        Flu Shot: 03/30/17 HIV:  Negative  Baby Food  Breast                              GBS:   Contraception  Undecided Pap: NIL HPV negative  CBB   Yes Chalmydia positive 06/14/2017  CS/VBAC  Not applicable   Support Person            [redacted] weeks gestation of pregnancy 06/14/2017 by [name], Andreas, MD 08/22/2017 by Nadara MustardHarris, Robert P, MD       Gestational age appropriate obstetric precautions including but not limited to vaginal bleeding, contractions, leaking of fluid and fetal movement were reviewed in detail with the patient.    Patient is having regular meetings with dietician, first one complete, has 2 more scheduled Will need anesthesia evaluation at 32 weeks  Return in about 2 weeks (around 10/28/2017) for ROB.  Richelle Itohristanna Alejandro G[facility], [facility] 10/14/2017, 5:57 PM

## 2017-10-28 ENCOUNTER — Encounter: Payer: 59 | Admitting: Maternal Newborn

## 2017-10-29 DIAGNOSIS — O09892 Supervision of other high risk pregnancies, second trimester: Secondary | ICD-10-CM | POA: Diagnosis not present

## 2017-11-04 ENCOUNTER — Ambulatory Visit: Payer: Self-pay | Admitting: Dietician

## 2017-11-05 ENCOUNTER — Telehealth: Payer: Self-pay | Admitting: Dietician

## 2017-11-05 NOTE — Telephone Encounter (Signed)
Called patient and left a message requesting that she call to reschedule her missed appointment for nutrition counseling 11/04/17.

## 2017-11-14 ENCOUNTER — Encounter: Payer: Self-pay | Admitting: Dietician

## 2017-11-21 DIAGNOSIS — O09892 Supervision of other high risk pregnancies, second trimester: Secondary | ICD-10-CM | POA: Diagnosis not present

## 2017-11-21 DIAGNOSIS — Z23 Encounter for immunization: Secondary | ICD-10-CM | POA: Diagnosis not present

## 2017-11-25 DIAGNOSIS — O0992 Supervision of high risk pregnancy, unspecified, second trimester: Secondary | ICD-10-CM | POA: Diagnosis not present

## 2017-11-29 DIAGNOSIS — O09892 Supervision of other high risk pregnancies, second trimester: Secondary | ICD-10-CM | POA: Diagnosis not present

## 2017-11-29 DIAGNOSIS — R7309 Other abnormal glucose: Secondary | ICD-10-CM | POA: Diagnosis not present

## 2017-12-17 DIAGNOSIS — O169 Unspecified maternal hypertension, unspecified trimester: Secondary | ICD-10-CM | POA: Diagnosis not present

## 2017-12-17 DIAGNOSIS — O09893 Supervision of other high risk pregnancies, third trimester: Secondary | ICD-10-CM | POA: Diagnosis not present

## 2017-12-17 DIAGNOSIS — O163 Unspecified maternal hypertension, third trimester: Secondary | ICD-10-CM | POA: Diagnosis not present

## 2017-12-31 DIAGNOSIS — O133 Gestational [pregnancy-induced] hypertension without significant proteinuria, third trimester: Secondary | ICD-10-CM | POA: Diagnosis not present

## 2018-01-02 ENCOUNTER — Observation Stay
Admission: EM | Admit: 2018-01-02 | Discharge: 2018-01-02 | Disposition: A | Discharge status: Home / Self Care | Payer: 59 | Attending: Obstetrics & Gynecology | Admitting: Obstetrics & Gynecology

## 2018-01-02 ENCOUNTER — Other Ambulatory Visit: Payer: Self-pay

## 2018-01-02 DIAGNOSIS — O10913 Unspecified pre-existing hypertension complicating pregnancy, third trimester: Secondary | ICD-10-CM | POA: Diagnosis not present

## 2018-01-02 DIAGNOSIS — O99213 Obesity complicating pregnancy, third trimester: Secondary | ICD-10-CM | POA: Insufficient documentation

## 2018-01-02 DIAGNOSIS — O0990 Supervision of high risk pregnancy, unspecified, unspecified trimester: Secondary | ICD-10-CM

## 2018-01-02 DIAGNOSIS — Z6841 Body Mass Index (BMI) 40.0 and over, adult: Secondary | ICD-10-CM | POA: Diagnosis not present

## 2018-01-02 DIAGNOSIS — O162 Unspecified maternal hypertension, second trimester: Principal | ICD-10-CM | POA: Insufficient documentation

## 2018-01-02 DIAGNOSIS — Z3A34 34 weeks gestation of pregnancy: Secondary | ICD-10-CM | POA: Diagnosis not present

## 2018-01-02 DIAGNOSIS — A5609 Other chlamydial infection of lower genitourinary tract: Secondary | ICD-10-CM

## 2018-01-02 DIAGNOSIS — O169 Unspecified maternal hypertension, unspecified trimester: Secondary | ICD-10-CM

## 2018-01-02 DIAGNOSIS — O99212 Obesity complicating pregnancy, second trimester: Secondary | ICD-10-CM

## 2018-01-02 DIAGNOSIS — O0993 Supervision of high risk pregnancy, unspecified, third trimester: Secondary | ICD-10-CM | POA: Diagnosis not present

## 2018-01-02 DIAGNOSIS — O09529 Supervision of elderly multigravida, unspecified trimester: Secondary | ICD-10-CM

## 2018-01-02 DIAGNOSIS — O99019 Anemia complicating pregnancy, unspecified trimester: Secondary | ICD-10-CM

## 2018-01-02 LAB — COMPREHENSIVE METABOLIC PANEL
ALBUMIN: 2.5 g/dL — AB (ref 3.5–5.0)
ALK PHOS: 73 U/L (ref 38–126)
ALT: 13 U/L — ABNORMAL LOW (ref 14–54)
ANION GAP: 5 (ref 5–15)
AST: 21 U/L (ref 15–41)
BILIRUBIN TOTAL: 0.2 mg/dL — AB (ref 0.3–1.2)
BUN: 8 mg/dL (ref 6–20)
CALCIUM: 7.6 mg/dL — AB (ref 8.9–10.3)
CO2: 22 mmol/L (ref 22–32)
Chloride: 109 mmol/L (ref 101–111)
Creatinine, Ser: 0.55 mg/dL (ref 0.44–1.00)
GFR calc non Af Amer: >60 mL/min (ref >60)
GLUCOSE: 112 mg/dL — AB (ref 65–99)
POTASSIUM: 3.5 mmol/L (ref 3.5–5.1)
SODIUM: 136 mmol/L (ref 135–145)
TOTAL PROTEIN: 5.8 g/dL — AB (ref 6.5–8.1)

## 2018-01-02 LAB — CBC
HCT: 29.6 % — ABNORMAL LOW (ref 35.0–47.0)
Hemoglobin: 9.8 g/dL — ABNORMAL LOW (ref 12.0–16.0)
MCH: 28.2 pg (ref 26.0–34.0)
MCHC: 33 g/dL (ref 32.0–36.0)
MCV: 85.5 fL (ref 80.0–100.0)
Platelets: 245 10*3/uL (ref 150–440)
RBC: 3.46 MIL/uL — ABNORMAL LOW (ref 3.80–5.20)
RDW: 16.1 % — AB (ref 11.5–14.5)
WBC: 12.3 10*3/uL — ABNORMAL HIGH (ref 3.6–11.0)

## 2018-01-02 LAB — PROTEIN / CREATININE RATIO, URINE
Creatinine, Urine: 187 mg/dL
Protein Creatinine Ratio: 0.15 mg/mg{Cre} (ref 0.00–0.15)
TOTAL PROTEIN, URINE: 28 mg/dL

## 2018-01-02 NOTE — Discharge Summary (Signed)
Kim Haley is a 37 y.o. female. She is at 4064w1d gestation. Patient's last menstrual period was 05/08/2017. Estimated Date of Delivery: 02/12/18  [facility]    Chief complaint: elevated BP in office  S: Resting comfortably. no CTX, no VB.no LOF,  Active fetal movement.  Denies: HA, visual changes, SOB, or RUQ/epigastric pain  Was seen in office today, 113/92.  Sent for labs and NST. Has CHTN- per non-maternal history via chart review; 145/95, in 2017, 141/94 in 2018. Has not been on meds this pregnancy.  Initial ob exam at 5 weeks was 138/94.  Maternal Medical History:  History reviewed. No pertinent past medical history.  Past Surgical History:  Procedure Laterality Date  . WISDOM TOOTH EXTRACTION      No Known Allergies  Prior to Admission medications   Medication Sig Start Date End Date Taking? Authorizing Provider  folic acid (FOLVITE) 1 MG tablet Take 1 tablet (1 mg total) daily by mouth. 06/14/17  Yes [name], Andreas, MD  Prenatal Vit-Fe Fumarate-FA (MULTIVITAMIN-PRENATAL) 27-0.8 MG TABS tablet Take 1 tablet by mouth daily at 12 noon.   Yes [provider]     Social History: She  reports that she has never smoked. She has never used smokeless tobacco. She reports that she does not drink alcohol or use drugs.  Family History: family history includes Cancer in her maternal aunt.    Review of Systems: A full review of systems was performed and negative except as noted in the HPI.     O:  Patient Vitals for the past 24 hrs:  BP Temp Temp src Pulse Resp SpO2 Height Weight  01/02/18 1831 - - - - - 94 % - -  01/02/18 1830 135/82 - - (!) 113 - - - -  01/02/18 1826 - - - - - 96 % - -  01/02/18 1821 - - - - - 97 % - -  01/02/18 1816 - - - - - 97 % - -  01/02/18 1815 131/80 - - (!) 112 - - - -  01/02/18 1811 - - - - - 95 % - -  01/02/18 1806 - - - - - 96 % - -  01/02/18 1801 - - - - - 95 % - -  01/02/18 1800 136/79 - - (!)  114 - - - -  01/02/18 1758 - - - - - 95 % - -  01/02/18 1756 - - - - - 95 % - -  01/02/18 1751 - - - - - 96 % - -  01/02/18 1745 (!) 143/81 - - (!) 114 - 97 % - -  01/02/18 1741 - - - - - 98 % - -  01/02/18 1736 - - - - - 96 % - -  01/02/18 1731 - - - - - 95 % - -  01/02/18 1730 131/70 - - (!) 115 - - - -  01/02/18 1726 - - - - - 96 % - -  01/02/18 1721 - - - - - 95 % - -  01/02/18 1716 - - - - - 94 % - -  01/02/18 1715 134/67 - - (!) 129 - - - -  01/02/18 1711 - - - - - 95 % - -  01/02/18 1706 - - - - - 94 % - -  01/02/18 1701 - - - - - 94 % - -  01/02/18 1700 126/80 - - (!) 111 - 94 % - -  01/02/18 1656 - - - - - 93 % - -  01/02/18 1651 - - - - - 95 % - -  01/02/18 1646 - - - - - 93 % - -  01/02/18 1645 122/76 - - (!) 120 - - - -  01/02/18 1641 - - - - - 97 % - -  01/02/18 1636 - - - - - 95 % - -  01/02/18 1631 - - - - - 92 % - -  01/02/18 1630 123/80 - - (!) 116 - - - -  01/02/18 1629 123/80 - - (!) 116 - - - -  01/02/18 1626 - - - - - 95 % - -  01/02/18 1621 - - - - - 96 % - -  01/02/18 1616 - - - - - 97 % - -  01/02/18 1615 (!) 146/94 - - (!) 150 - - - -  01/02/18 1606 - - - - - 96 % - -  01/02/18 1601 - - - - - 96 % - -  01/02/18 1600 133/80 - - (!) 121 - - - -  01/02/18 1556 - - - - - 95 % - -  01/02/18 1551 - - - - - 95 % - -  01/02/18 1546 - - - - - 96 % - -  01/02/18 1545 137/82 - - (!) 116 - - - -  01/02/18 1541 - - - - - 99 % - -  01/02/18 1536 - - - - - 98 % - -  01/02/18 1531 - - - - - 100 % - -  01/02/18 1527 - - - - - 99 % - -  01/02/18 1521 - - - - - 98 % - -  01/02/18 1518 132/86 - - (!) 128 - - - -  01/02/18 1516 - - - - - 97 % - -  01/02/18 1512 (!) 135/92 97.7 F (36.5 C) Oral (!) 130 14 - 5\' 5"  (1.651 m) (!) 140.6 kg (310 lb)    BP 135/82   Pulse (!) 113   Temp 97.7 F (36.5 C) (Oral)   Resp 14   Ht 5\' 5"  (1.651 m)   Wt (!) 140.6 kg (310 lb)   LMP 05/08/2017   SpO2 94%   BMI 51.59 kg/m  Results for orders placed or performed during the  hospital encounter of 01/02/18 (from the past 48 hour(s))  CBC   Collection Time: 01/02/18  4:37 PM  Result Value Ref Range   WBC 12.3 (H) 3.6 - 11.0 K/uL   RBC 3.46 (L) 3.80 - 5.20 MIL/uL   Hemoglobin 9.8 (L) 12.0 - 16.0 g/dL   HCT 69.6 (L) 29.5 - 28.4 %   MCV 85.5 80.0 - 100.0 fL   MCH 28.2 26.0 - 34.0 pg   MCHC 33.0 32.0 - 36.0 g/dL   RDW 13.2 (H) 44.0 - 10.2 %   Platelets 245 150 - 440 K/uL  Comprehensive metabolic panel   Collection Time: 01/02/18  4:37 PM  Result Value Ref Range   Sodium 136 135 - 145 mmol/L   Potassium 3.5 3.5 - 5.1 mmol/L   Chloride 109 101 - 111 mmol/L   CO2 22 22 - 32 mmol/L   Glucose, Bld 112 (H) 65 - 99 mg/dL   BUN 8 6 - 20 mg/dL   Creatinine, Ser 7.25 0.44 - 1.00 mg/dL   Calcium 7.6 (L) 8.9 - 10.3 mg/dL   Total Protein 5.8 (L) 6.5 -  8.1 g/dL   Albumin 2.5 (L) 3.5 - 5.0 g/dL   AST 21 15 - 41 U/L   ALT 13 (L) 14 - 54 U/L   Alkaline Phosphatase 73 38 - 126 U/L   Total Bilirubin 0.2 (L) 0.3 - 1.2 mg/dL   GFR calc non Af Amer >60 >60 mL/min   GFR calc Af Amer >60 >60 mL/min   Anion gap 5 5 - 15     Constitutional: NAD, AAOx3  HE/ENT: extraocular movements grossly intact, moist mucous membranes CV: RRR PULM: nl respiratory effort, CTABL     Abd: gravid, non-tender, non-distended, soft      Ext: Non-tender, 2+edema bilateral anlkes to mid-calf.  Psych: mood appropriate, speech normal Pelvic deferred  NST:  Baseline:  135 Variability: moderate Accelerations present x >2 Decelerations absent Time  A/P: 37 y.o. [redacted]w[redacted]d here for antenatal surveillance for increasing BP in setting of cHTN   Labor: not present.   Fetal Wellbeing: Reassuring Cat 1 tracing.  Reactive NST   CHTN: normal to mild range pressures.  Labs normal, p/c ratio still pending,but will not change management at this point.  Has NST and office visit scheduled for Tuesday.    Anesthesia consult done today, due to her recent weight gain, they want to see her again  around 36 weeks.    D/c home stable, precautions reviewed, follow-up as scheduled.   ----- Ranae Plumber, MD Attending Obstetrician and Gynecologist [facility], Department of OB/GYN [facility]

## 2018-01-02 NOTE — Progress Notes (Signed)
Went over discharge instructions with pt. Education provided on hypertension in pregnancy and reasons to be evaluated regarding high BP in pregnancy. Labor precautions provided and pt verbalized understanding.

## 2018-01-02 NOTE — Consult Note (Signed)
Anesthesiology consult note ( Ambulatory referral to OB anesthesia for morbid obesity) :  I had the distinct pleasure of meeting Kim Haley today for an OB anesthesia precheck.  She has a history of morbid obesity with a BMI today of 51.59.  She is 3030w1d.  Patient reports no previous problems with anesthesia other than a one sided epidural, no prior surgeries on her back and has a MP score of 3.  We discussed the risks of Obesity in Pregnancy including but not limited to: Marland Kitchen. Patients with a BMI > 40 are at increased risk for complications during pregnancy, such as hypertensive disorders of pregnancy, gestational diabetes, obstructive sleep apnea, thromboembolic disease, prolonged labor, operative vaginal delivery and need for cesarean delivery. . There is an increased risk of airway complications, including inability to place a breathing tube, should an emergency cesarean delivery be required. . There is an increased risk of aspiration, where stomach contents get into the lungs and can cause inflammation, infection and even respiratory failure. Marland Kitchen. Epidural placement is more difficult in obesity, often requires multiple attempts, more often results in inadequate pain relief, more often requires replacement due to movement of the epidural catheter and more often results in accidental dural puncture and post dural puncture headache.  I explained to the patient that we would follow our Obesity in Pregnancy Protocol: . An anesthesiology consultation is recommended for all patients with a pre-pregnancy BMI ? 45. o During this consultation the anesthesiologist will ask you questions about your medical history as well as do a physical exam that specifically looks for features that are predictive of complications of anesthesia.  o The anesthesiologist will also review with you potential complications of anesthesia that are specifically increased due to obesity during pregnancy.  . If your BMI ? 49 at your 34  week appointment you will have to be evaluated by an anesthesiologist prior to 36 weeks. o This evaluation will specifically determine whether you are at high risk for complications of anesthesia. o If you are found to be at high risk for complications of anesthesia your OB provider will be directed to transfer your care to an OB provider at a hospital that has a higher maternal level of care designation  Patient voiced understanding acknowledging that we had discussed her pathway forward.  We would like to re evaluate the patient again at 36 weeks due to her super morbid obesity.

## 2018-01-07 DIAGNOSIS — O133 Gestational [pregnancy-induced] hypertension without significant proteinuria, third trimester: Secondary | ICD-10-CM | POA: Diagnosis not present

## 2018-01-10 DIAGNOSIS — O163 Unspecified maternal hypertension, third trimester: Secondary | ICD-10-CM | POA: Diagnosis not present

## 2018-01-10 DIAGNOSIS — O09893 Supervision of other high risk pregnancies, third trimester: Secondary | ICD-10-CM | POA: Diagnosis not present

## 2018-01-14 DIAGNOSIS — O10913 Unspecified pre-existing hypertension complicating pregnancy, third trimester: Secondary | ICD-10-CM | POA: Diagnosis not present

## 2018-01-17 ENCOUNTER — Encounter
Admission: RE | Admit: 2018-01-17 | Discharge: 2018-01-17 | Disposition: A | Discharge status: Home / Self Care | Payer: 59 | Source: Ambulatory Visit | Attending: Anesthesiology | Admitting: Anesthesiology

## 2018-01-17 DIAGNOSIS — O09893 Supervision of other high risk pregnancies, third trimester: Secondary | ICD-10-CM | POA: Diagnosis not present

## 2018-01-17 DIAGNOSIS — O10919 Unspecified pre-existing hypertension complicating pregnancy, unspecified trimester: Secondary | ICD-10-CM | POA: Diagnosis not present

## 2018-01-17 NOTE — Care Management (Signed)
Dr. Randa NgoPiscitello saw her 6/6 when her BMI was 51. Now BMI is 54. She will be transferred to a tertiary center. She understands that she will get one on one care from an anesthesiologist at a large center, whereas we don't have that capacity. She understands. She is an Nutritional therapistR nurse.

## 2018-01-21 DIAGNOSIS — O09893 Supervision of other high risk pregnancies, third trimester: Secondary | ICD-10-CM | POA: Diagnosis not present

## 2018-01-21 DIAGNOSIS — O10913 Unspecified pre-existing hypertension complicating pregnancy, third trimester: Secondary | ICD-10-CM | POA: Diagnosis not present

## 2018-01-23 DIAGNOSIS — Z6841 Body Mass Index (BMI) 40.0 and over, adult: Secondary | ICD-10-CM | POA: Diagnosis not present

## 2018-01-23 DIAGNOSIS — O10919 Unspecified pre-existing hypertension complicating pregnancy, unspecified trimester: Secondary | ICD-10-CM | POA: Diagnosis not present

## 2018-01-23 DIAGNOSIS — O0993 Supervision of high risk pregnancy, unspecified, third trimester: Secondary | ICD-10-CM | POA: Diagnosis not present

## 2018-01-27 ENCOUNTER — Telehealth (HOSPITAL_COMMUNITY): Payer: Self-pay | Admitting: *Deleted

## 2018-01-27 ENCOUNTER — Telehealth: Payer: Self-pay

## 2018-01-27 ENCOUNTER — Encounter: Payer: Self-pay | Admitting: Radiology

## 2018-01-27 DIAGNOSIS — O10913 Unspecified pre-existing hypertension complicating pregnancy, third trimester: Secondary | ICD-10-CM

## 2018-01-27 NOTE — Telephone Encounter (Signed)
Preadmission screen  

## 2018-01-27 NOTE — Telephone Encounter (Signed)
Patient has been scheduled for appointment on 01/29/2018.

## 2018-01-28 ENCOUNTER — Telehealth (HOSPITAL_COMMUNITY): Payer: Self-pay | Admitting: *Deleted

## 2018-01-28 ENCOUNTER — Encounter (HOSPITAL_COMMUNITY): Payer: Self-pay | Admitting: *Deleted

## 2018-01-28 NOTE — Telephone Encounter (Signed)
Preadmission screen  

## 2018-01-29 ENCOUNTER — Ambulatory Visit (INDEPENDENT_AMBULATORY_CARE_PROVIDER_SITE_OTHER): Payer: 59 | Admitting: Obstetrics & Gynecology

## 2018-01-29 ENCOUNTER — Encounter: Payer: Self-pay | Admitting: Obstetrics & Gynecology

## 2018-01-29 ENCOUNTER — Ambulatory Visit (HOSPITAL_COMMUNITY): Payer: 59

## 2018-01-29 ENCOUNTER — Encounter (HOSPITAL_COMMUNITY): Payer: Self-pay

## 2018-01-29 ENCOUNTER — Other Ambulatory Visit: Payer: Self-pay

## 2018-01-29 ENCOUNTER — Inpatient Hospital Stay (HOSPITAL_COMMUNITY)
Admission: AD | Admit: 2018-01-29 | Discharge: 2018-01-29 | Disposition: A | Discharge status: Home / Self Care | Payer: 59 | Source: Ambulatory Visit | Attending: Obstetrics and Gynecology | Admitting: Obstetrics and Gynecology

## 2018-01-29 ENCOUNTER — Inpatient Hospital Stay (HOSPITAL_COMMUNITY): Payer: 59

## 2018-01-29 VITALS — BP 146/90 | Ht 66.0 in | Wt 333.0 lb

## 2018-01-29 DIAGNOSIS — O99213 Obesity complicating pregnancy, third trimester: Secondary | ICD-10-CM | POA: Diagnosis not present

## 2018-01-29 DIAGNOSIS — Z3689 Encounter for other specified antenatal screening: Secondary | ICD-10-CM

## 2018-01-29 DIAGNOSIS — O09521 Supervision of elderly multigravida, first trimester: Secondary | ICD-10-CM

## 2018-01-29 DIAGNOSIS — Z809 Family history of malignant neoplasm, unspecified: Secondary | ICD-10-CM | POA: Diagnosis not present

## 2018-01-29 DIAGNOSIS — O99212 Obesity complicating pregnancy, second trimester: Secondary | ICD-10-CM

## 2018-01-29 DIAGNOSIS — O0993 Supervision of high risk pregnancy, unspecified, third trimester: Secondary | ICD-10-CM | POA: Diagnosis not present

## 2018-01-29 DIAGNOSIS — O10913 Unspecified pre-existing hypertension complicating pregnancy, third trimester: Secondary | ICD-10-CM

## 2018-01-29 DIAGNOSIS — M7989 Other specified soft tissue disorders: Secondary | ICD-10-CM | POA: Insufficient documentation

## 2018-01-29 DIAGNOSIS — Z3A38 38 weeks gestation of pregnancy: Secondary | ICD-10-CM | POA: Diagnosis not present

## 2018-01-29 DIAGNOSIS — Z7982 Long term (current) use of aspirin: Secondary | ICD-10-CM | POA: Diagnosis not present

## 2018-01-29 DIAGNOSIS — O09523 Supervision of elderly multigravida, third trimester: Secondary | ICD-10-CM

## 2018-01-29 DIAGNOSIS — O163 Unspecified maternal hypertension, third trimester: Secondary | ICD-10-CM | POA: Insufficient documentation

## 2018-01-29 DIAGNOSIS — Z79899 Other long term (current) drug therapy: Secondary | ICD-10-CM | POA: Diagnosis not present

## 2018-01-29 DIAGNOSIS — O0990 Supervision of high risk pregnancy, unspecified, unspecified trimester: Secondary | ICD-10-CM

## 2018-01-29 DIAGNOSIS — I10 Essential (primary) hypertension: Secondary | ICD-10-CM | POA: Diagnosis present

## 2018-01-29 HISTORY — DX: Gestational (pregnancy-induced) hypertension without significant proteinuria, unspecified trimester: O13.9

## 2018-01-29 LAB — CBC
HCT: 32.7 % — ABNORMAL LOW (ref 36.0–46.0)
Hemoglobin: 10.7 g/dL — ABNORMAL LOW (ref 12.0–15.0)
MCH: 29.2 pg (ref 26.0–34.0)
MCHC: 32.7 g/dL (ref 30.0–36.0)
MCV: 89.1 fL (ref 78.0–100.0)
Platelets: 217 10*3/uL (ref 150–400)
RBC: 3.67 MIL/uL — ABNORMAL LOW (ref 3.87–5.11)
RDW: 16.5 % — ABNORMAL HIGH (ref 11.5–15.5)
WBC: 11.9 10*3/uL — ABNORMAL HIGH (ref 4.0–10.5)

## 2018-01-29 LAB — COMPREHENSIVE METABOLIC PANEL
ALT: 12 U/L (ref 0–44)
AST: 19 U/L (ref 15–41)
Albumin: 2.5 g/dL — ABNORMAL LOW (ref 3.5–5.0)
Alkaline Phosphatase: 96 U/L (ref 38–126)
Anion gap: 10 (ref 5–15)
BUN: 8 mg/dL (ref 6–20)
CO2: 19 mmol/L — ABNORMAL LOW (ref 22–32)
Calcium: 8.3 mg/dL — ABNORMAL LOW (ref 8.9–10.3)
Chloride: 107 mmol/L (ref 98–111)
Creatinine, Ser: 0.6 mg/dL (ref 0.44–1.00)
GFR calc Af Amer: >60 mL/min (ref >60)
GFR calc non Af Amer: >60 mL/min (ref >60)
Glucose, Bld: 94 mg/dL (ref 70–99)
Potassium: 3.5 mmol/L (ref 3.5–5.1)
Sodium: 136 mmol/L (ref 135–145)
Total Bilirubin: 0.3 mg/dL (ref 0.3–1.2)
Total Protein: 6 g/dL — ABNORMAL LOW (ref 6.5–8.1)

## 2018-01-29 LAB — URINALYSIS, ROUTINE W REFLEX MICROSCOPIC
Bilirubin Urine: NEGATIVE
Glucose, UA: NEGATIVE mg/dL
Hgb urine dipstick: NEGATIVE
Ketones, ur: NEGATIVE mg/dL
Nitrite: NEGATIVE
Protein, ur: NEGATIVE mg/dL
Specific Gravity, Urine: 1.013 (ref 1.005–1.030)
pH: 7 (ref 5.0–8.0)

## 2018-01-29 LAB — PROTEIN / CREATININE RATIO, URINE
Creatinine, Urine: 95 mg/dL
Protein Creatinine Ratio: 0.13 mg/mg{Cre} (ref 0.00–0.15)
Total Protein, Urine: 12 mg/dL

## 2018-01-29 NOTE — Progress Notes (Signed)
  Subjective:    Kim Haley is being seen today for her first obstetrical visit.   She is at 5632w0d gestation. Her obstetrical history is significant for advanced maternal age, excessive weight gain, obesity and chronic htn with pregnancy. . Patient does intend to breast feed. Pregnancy history fully reviewed.  Patient reports extreme pedal edema.  Review of Systems:   Review of Systems  Objective:     BP (!) 146/90   Wt (!) 333 lb (151 kg)   LMP 05/08/2017   BMI 55.41 kg/m  Physical Exam  Exam Breathing, conversing, and ambulating normally Well nourished, well hydrated White female, no apparent distress Heart- rrr Lungs- CTAB Abd-benign 4+ pedal edema   Assessment:    Pregnancy: G2P1001 Patient Active Problem List   Diagnosis Date Noted  . Labor and delivery indication for care or intervention 01/02/2018  . Anemia of mother in pregnancy, antepartum 06/18/2017  . Chlamydial cervicitis 06/18/2017  . Obesity affecting pregnancy in second trimester 06/14/2017  . Antepartum multigravida of advanced maternal age 62/16/2018  . Elevated blood pressure affecting pregnancy, antepartum 06/14/2017  . Supervision of high risk pregnancy, antepartum 06/14/2017       Plan:     Initial labs drawn. Prenatal vitamins. Problem list reviewed and updated. [facility] but was dismissed from the practice. She was told that this was related to her high risk status. She will go to the [facility] for evaluation of her BP today.    Kim Haley 01/29/2018

## 2018-01-29 NOTE — Addendum Note (Signed)
Addended by: Cheree DittoGRAHAM, DEMETRICE A on: 01/29/2018 05:01 PM   Modules accepted: Kipp BroodSmartSet

## 2018-01-29 NOTE — MAU Provider Note (Signed)
Chief Complaint:  Hypertension   First Provider Initiated Contact with Patient 01/29/18 1119      HPI: Kim Haley is a 37 y.o. G2P1001 at 57w0dwho presents to maternity admissions being sent over from the office for Hypertension. She presented for her initial prenatal visit at [facility] after being transferred from [place] [facility]. She reports elevated BP during this pregnancy but does not remember specific on when it began. In the office her BP was 146/90. She denies HA, vision changes or RUQ pain. She reports increased swelling in her feet and legs that started 3 weeks ago.She reports good fetal movement, denies LOF, vaginal bleeding, vaginal itching/burning, contractions or abdominal pain.   Past Medical History: Past Medical History:  Diagnosis Date  . Complication of anesthesia    woke up during surgery and epidural was one sided  . Morbidly obese (HCC)   . Pregnancy induced hypertension     Past obstetric history: OB History  Gravida Para Term Preterm AB Living  2 1 1     1   SAB TAB Ectopic Multiple Live Births          1    # Outcome Date GA Lbr Len/2nd Weight Sex Delivery Anes PTL Lv  2 Current           1 Term 09/02/10    F Vag-Spont   LIV    Past Surgical History: Past Surgical History:  Procedure Laterality Date  . WISDOM TOOTH EXTRACTION      Family History: Family History  Problem Relation Age of Onset  . Cancer Maternal Aunt     Social History: Social History   Tobacco Use  . Smoking status: Never Smoker  . Smokeless tobacco: Never Used  Substance Use Topics  . Alcohol use: No    Alcohol/week: 0.0 oz  . Drug use: No    Allergies: No Known Allergies  Meds:  Medications Prior to Admission  Medication Sig Dispense Refill Last Dose  . Ascorbic Acid (VITA-C PO) Take 1 tablet by mouth daily.    01/29/2018 at Unknown time  . aspirin EC 81 MG tablet Take 81 mg by mouth daily.    01/29/2018 at Unknown time  . calcium carbonate (TUMS - DOSED IN MG  ELEMENTAL CALCIUM) 500 MG chewable tablet Chew 1 tablet by mouth 2 (two) times daily as needed for indigestion or heartburn.   Past Week at Unknown time  . diphenhydrAMINE (BENADRYL ALLERGY) 25 MG tablet Take 25 mg by mouth daily as needed for itching (rash).    01/28/2018 at Unknown time  . ferrous sulfate 325 (65 FE) MG tablet Take 325 mg by mouth 2 (two) times daily with a meal.    01/29/2018 at Unknown time  . Prenatal Vit-Fe Fumarate-FA (MULTIVITAMIN-PRENATAL) 27-0.8 MG TABS tablet Take 1 tablet by mouth daily at 12 noon.   01/29/2018 at Unknown time  . folic acid (FOLVITE) 1 MG tablet Take 1 tablet (1 mg total) daily by mouth. 30 tablet 10 Taking    ROS:  Review of Systems  Constitutional:       Positive for hypertension  Respiratory: Negative.   Cardiovascular: Negative.   Gastrointestinal: Negative.   Genitourinary: Negative.   Neurological: Negative.    I have reviewed patient's Past Medical Hx, Surgical Hx, Family Hx, Social Hx, medications and allergies.   Physical Exam   Patient Vitals for the past 24 hrs:  BP Temp Temp src Pulse Resp SpO2  01/29/18 1353 137/80 - - Marland Kitchen)  113 20 -  01/29/18 1300 137/80 - - (!) 121 - 97 %  01/29/18 1131 137/81 - - (!) 111 - -  01/29/18 1129 - - - - - 96 %  01/29/18 1125 - - - - - 97 %  01/29/18 1120 - - - - - 96 %  01/29/18 1116 (!) 144/83 - - (!) 118 - -  01/29/18 1100 (!) 145/85 - - (!) 104 - -  01/29/18 1049 (!) 141/87 98.2 F (36.8 C) Oral (!) 101 18 99 %   Constitutional: Well-developed, obese female in no acute distress.  Cardiovascular: tachycardia  Respiratory: normal effort GI: Abd soft, non-tender, gravid appropriate for gestational age.  MS: Extremities nontender, +2 pedal and ankle edema bilaterally, normal ROM Neurologic: Alert and oriented x 4.  GU: Neg CVAT. PELVIC EXAM: deferred  FHT:  Baseline 135, moderate variability, accelerations present, no decelerations Contractions: UI   Labs: Results for orders placed or  performed during the hospital encounter of 01/29/18 (from the past 24 hour(s))  Protein / creatinine ratio, urine     Status: None   Collection Time: 01/29/18 10:56 AM  Result Value Ref Range   Creatinine, Urine 95.00 mg/dL   Total Protein, Urine 12 mg/dL   Protein Creatinine Ratio 0.13 0.00 - 0.15 mg/mg[Cre]  Urinalysis, Routine w reflex microscopic     Status: Abnormal   Collection Time: 01/29/18 10:56 AM  Result Value Ref Range   Color, Urine YELLOW YELLOW   APPearance HAZY (A) CLEAR   Specific Gravity, Urine 1.013 1.005 - 1.030   pH 7.0 5.0 - 8.0   Glucose, UA NEGATIVE NEGATIVE mg/dL   Hgb urine dipstick NEGATIVE NEGATIVE   Bilirubin Urine NEGATIVE NEGATIVE   Ketones, ur NEGATIVE NEGATIVE mg/dL   Protein, ur NEGATIVE NEGATIVE mg/dL   Nitrite NEGATIVE NEGATIVE   Leukocytes, UA SMALL (A) NEGATIVE   RBC / HPF 0-5 0 - 5 RBC/hpf   WBC, UA 6-10 0 - 5 WBC/hpf   Bacteria, UA MANY (A) NONE SEEN   Squamous Epithelial / LPF 21-50 0 - 5   Mucus PRESENT   CBC     Status: Abnormal   Collection Time: 01/29/18 11:30 AM  Result Value Ref Range   WBC 11.9 (H) 4.0 - 10.5 K/uL   RBC 3.67 (L) 3.87 - 5.11 MIL/uL   Hemoglobin 10.7 (L) 12.0 - 15.0 g/dL   HCT 16.1 (L) 09.6 - 04.5 %   MCV 89.1 78.0 - 100.0 fL   MCH 29.2 26.0 - 34.0 pg   MCHC 32.7 30.0 - 36.0 g/dL   RDW 40.9 (H) 81.1 - 91.4 %   Platelets 217 150 - 400 K/uL  Comprehensive metabolic panel     Status: Abnormal   Collection Time: 01/29/18 11:30 AM  Result Value Ref Range   Sodium 136 135 - 145 mmol/L   Potassium 3.5 3.5 - 5.1 mmol/L   Chloride 107 98 - 111 mmol/L   CO2 19 (L) 22 - 32 mmol/L   Glucose, Bld 94 70 - 99 mg/dL   BUN 8 6 - 20 mg/dL   Creatinine, Ser 7.82 0.44 - 1.00 mg/dL   Calcium 8.3 (L) 8.9 - 10.3 mg/dL   Total Protein 6.0 (L) 6.5 - 8.1 g/dL   Albumin 2.5 (L) 3.5 - 5.0 g/dL   AST 19 15 - 41 U/L   ALT 12 0 - 44 U/L   Alkaline Phosphatase 96 38 - 126 U/L   Total Bilirubin 0.3 0.3 -  1.2 mg/dL   GFR calc non Af  Amer >60 >60 mL/min   GFR calc Af Amer >60 >60 mL/min   Anion gap 10 5 - 15   O/Positive/-- (11/16 [id number])  Imaging:  BPP completed on 01/29/18 FHR 136 Presentation: Cephalic  AFV: WNL  BPP 8/8  MAU Course/MDM: Orders Placed This Encounter  Procedures  . Culture, OB Urine  . US MFM Fetal BPP Wo Non Stress  . CBC  . Comprehensive metabolic panel  . Protein / creatinine ratio, urine  . Urinalysis, Routine w reflex microscopic  . Discharge patient Discharge disposition: 01-Home or Self Care; Discharge patient date: 01/29/2018   PEC labs- normal  NST reviewed- reactive  Urine culture- pending   Records reviewed from previous OBGYN offices, prenatal records showed elevated BP starting at 5021w2d of 138/94 continuing to 4284w1d [facility] 136/92 [facility]. [facility]  Where she had elevated BP at 3078w6d of 140/80 and at 7171w6d of 142/87.   With blood pressure elevation beginning in this pregnancy prior to [redacted] weeks gestation- chronic hypertension is diagnosed which is consistent with scheduled IOL on 02/05/2018 at 39 weeks.   Consult Dr Earlene Plateravis with presentation, exam findings, prenatal history and test results. Recommends getting BPP today prior to discharge home [facility]. Okay to discharge home after BPP.   BPP 8/8. Patient discharged with strict PEC precautions. Pt stable at time of discharge. IOL scheduled for 1 week from today.   Assessment: 1. Maternal chronic hypertension in third trimester   2. [redacted] weeks gestation of pregnancy   3. NST (non-stress test) reactive on fetal surveillance     Plan: Discharge home Labor precautions and fetal kick counts PEC precautions  Return to MAU as needed  Follow up as scheduled in the office for prenatal appointments and US  [facility]   Follow-up Information    [facility].   Specialty:  Obstetrics and Gynecology Why:  Follow up as scheduled  for prenatal care and return to MAU as needed for signs and symptoms of preeclampsia  Contact information: [address] [place] [id number] [phone]          Allergies as of 01/29/2018   No Known Allergies     Medication List    STOP taking these medications   folic acid 1 MG tablet Commonly known as:  FOLVITE     TAKE these medications   aspirin EC 81 MG tablet Take 81 mg by mouth daily.   BENADRYL ALLERGY 25 MG tablet Generic drug:  diphenhydrAMINE Take 25 mg by mouth daily as needed for itching (rash).   calcium carbonate 500 MG chewable tablet Commonly known as:  TUMS - dosed in mg elemental calcium Chew 1 tablet by mouth 2 (two) times daily as needed for indigestion or heartburn.   ferrous sulfate 325 (65 FE) MG tablet Take 325 mg by mouth 2 (two) times daily with a meal.   multivitamin-prenatal 27-0.8 MG Tabs tablet Take 1 tablet by mouth daily at 12 noon.   VITA-C PO Take 1 tablet by mouth daily.       Steward DroneVeronica Mehki Klumpp Certified Nurse-Midwife 01/29/2018 1:47 PM

## 2018-01-29 NOTE — MAU Note (Signed)
BP was elevated today, swelling has gotten pretty bad.  Sent in for Pre-e eval.  Denies HA, visual changes or epigastric pain.

## 2018-01-29 NOTE — MAU Note (Signed)
Urine in lab 

## 2018-01-29 NOTE — Progress Notes (Signed)
AFI u/s completed today:  Vertex FHT 150 AFI 11.3 cm

## 2018-01-30 LAB — CULTURE, OB URINE

## 2018-02-03 ENCOUNTER — Telehealth: Payer: Self-pay | Admitting: Radiology

## 2018-02-03 ENCOUNTER — Ambulatory Visit (HOSPITAL_BASED_OUTPATIENT_CLINIC_OR_DEPARTMENT_OTHER)
Admission: RE | Admit: 2018-02-03 | Discharge: 2018-02-03 | Disposition: A | Discharge status: Home / Self Care | Payer: 59 | Source: Ambulatory Visit | Attending: Obstetrics and Gynecology | Admitting: Obstetrics and Gynecology

## 2018-02-03 ENCOUNTER — Other Ambulatory Visit: Payer: Self-pay | Admitting: Obstetrics and Gynecology

## 2018-02-03 DIAGNOSIS — O99213 Obesity complicating pregnancy, third trimester: Secondary | ICD-10-CM

## 2018-02-03 DIAGNOSIS — Z3A38 38 weeks gestation of pregnancy: Secondary | ICD-10-CM

## 2018-02-03 DIAGNOSIS — A5609 Other chlamydial infection of lower genitourinary tract: Secondary | ICD-10-CM | POA: Diagnosis not present

## 2018-02-03 DIAGNOSIS — O10913 Unspecified pre-existing hypertension complicating pregnancy, third trimester: Secondary | ICD-10-CM

## 2018-02-03 DIAGNOSIS — E669 Obesity, unspecified: Secondary | ICD-10-CM | POA: Diagnosis not present

## 2018-02-03 DIAGNOSIS — O163 Unspecified maternal hypertension, third trimester: Secondary | ICD-10-CM | POA: Diagnosis not present

## 2018-02-03 DIAGNOSIS — O09523 Supervision of elderly multigravida, third trimester: Secondary | ICD-10-CM

## 2018-02-03 DIAGNOSIS — O1092 Unspecified pre-existing hypertension complicating childbirth: Secondary | ICD-10-CM | POA: Diagnosis not present

## 2018-02-03 DIAGNOSIS — O0993 Supervision of high risk pregnancy, unspecified, third trimester: Secondary | ICD-10-CM | POA: Diagnosis not present

## 2018-02-03 DIAGNOSIS — D649 Anemia, unspecified: Secondary | ICD-10-CM | POA: Diagnosis not present

## 2018-02-03 DIAGNOSIS — Z3A39 39 weeks gestation of pregnancy: Secondary | ICD-10-CM | POA: Diagnosis not present

## 2018-02-03 DIAGNOSIS — O99214 Obesity complicating childbirth: Secondary | ICD-10-CM | POA: Diagnosis not present

## 2018-02-03 DIAGNOSIS — O99013 Anemia complicating pregnancy, third trimester: Secondary | ICD-10-CM | POA: Diagnosis not present

## 2018-02-03 DIAGNOSIS — Z7982 Long term (current) use of aspirin: Secondary | ICD-10-CM | POA: Diagnosis not present

## 2018-02-03 NOTE — Telephone Encounter (Signed)
Left voicemail for patient to call [id number] to schedule later appointment or differnet date with Dr Macon LargeAnyanwu due to jury duty.

## 2018-02-04 ENCOUNTER — Encounter: Payer: Self-pay | Admitting: Obstetrics & Gynecology

## 2018-02-04 ENCOUNTER — Ambulatory Visit (INDEPENDENT_AMBULATORY_CARE_PROVIDER_SITE_OTHER): Payer: 59 | Admitting: Obstetrics & Gynecology

## 2018-02-04 VITALS — BP 124/87 | HR 130 | Wt 339.4 lb

## 2018-02-04 DIAGNOSIS — O163 Unspecified maternal hypertension, third trimester: Secondary | ICD-10-CM | POA: Diagnosis not present

## 2018-02-04 DIAGNOSIS — O0993 Supervision of high risk pregnancy, unspecified, third trimester: Secondary | ICD-10-CM

## 2018-02-04 DIAGNOSIS — O10913 Unspecified pre-existing hypertension complicating pregnancy, third trimester: Secondary | ICD-10-CM

## 2018-02-04 DIAGNOSIS — E669 Obesity, unspecified: Secondary | ICD-10-CM

## 2018-02-04 DIAGNOSIS — O09523 Supervision of elderly multigravida, third trimester: Secondary | ICD-10-CM | POA: Diagnosis not present

## 2018-02-04 DIAGNOSIS — D649 Anemia, unspecified: Secondary | ICD-10-CM | POA: Diagnosis not present

## 2018-02-04 DIAGNOSIS — O99019 Anemia complicating pregnancy, unspecified trimester: Secondary | ICD-10-CM

## 2018-02-04 DIAGNOSIS — O99013 Anemia complicating pregnancy, third trimester: Secondary | ICD-10-CM

## 2018-02-04 DIAGNOSIS — A5609 Other chlamydial infection of lower genitourinary tract: Secondary | ICD-10-CM

## 2018-02-04 DIAGNOSIS — O09529 Supervision of elderly multigravida, unspecified trimester: Secondary | ICD-10-CM

## 2018-02-04 DIAGNOSIS — O99213 Obesity complicating pregnancy, third trimester: Secondary | ICD-10-CM | POA: Diagnosis not present

## 2018-02-04 DIAGNOSIS — O169 Unspecified maternal hypertension, unspecified trimester: Secondary | ICD-10-CM

## 2018-02-04 DIAGNOSIS — O99212 Obesity complicating pregnancy, second trimester: Secondary | ICD-10-CM

## 2018-02-04 DIAGNOSIS — O10919 Unspecified pre-existing hypertension complicating pregnancy, unspecified trimester: Secondary | ICD-10-CM

## 2018-02-04 DIAGNOSIS — O0990 Supervision of high risk pregnancy, unspecified, unspecified trimester: Secondary | ICD-10-CM

## 2018-02-04 NOTE — Patient Instructions (Signed)
Labor Induction Labor induction is when steps are taken to cause a pregnant woman to begin the labor process. Most women go into labor on their own between 37 weeks and 42 weeks of the pregnancy. When this does not happen or when there is a medical need, methods may be used to induce labor. Labor induction causes a pregnant woman's uterus to contract. It also causes the cervix to soften (ripen), open (dilate), and thin out (efface). Usually, labor is not induced before 39 weeks of the pregnancy unless there is a problem with the baby or mother. Before inducing labor, your health care provider will consider a number of factors, including the following:  The medical condition of you and the baby.  How many weeks along you are.  The status of the baby's lung maturity.  The condition of the cervix.  The position of the baby. What are the reasons for labor induction? Labor may be induced for the following reasons:  The health of the baby or mother is at risk.  The pregnancy is overdue by 1 week or more.  The water breaks but labor does not start on its own.  The mother has a health condition or serious illness, such as high blood pressure, infection, placental abruption, or diabetes.  The amniotic fluid amounts are low around the baby.  The baby is distressed. Convenience or wanting the baby to be born on a certain date is not a reason for inducing labor. What methods are used for labor induction? Several methods of labor induction may be used, such as:  Prostaglandin medicine. This medicine causes the cervix to dilate and ripen. The medicine will also start contractions. It can be taken by mouth or by inserting a suppository into the vagina.  Inserting a thin tube (catheter) with a balloon on the end into the vagina to dilate the cervix. Once inserted, the balloon is expanded with water, which causes the cervix to open.  Stripping the membranes. Your health care provider separates  amniotic sac tissue from the cervix, causing the cervix to be stretched and causing the release of a hormone called progesterone. This may cause the uterus to contract. It is often done during an office visit. You will be sent home to wait for the contractions to begin. You will then come in for an induction.  Breaking the water. Your health care provider makes a hole in the amniotic sac using a small instrument. Once the amniotic sac breaks, contractions should begin. This may still take hours to see an effect.  Medicine to trigger or strengthen contractions. This medicine is given through an IV access tube inserted into a vein in your arm. All of the methods of induction, besides stripping the membranes, will be done in the hospital. Induction is done in the hospital so that you and the baby can be carefully monitored. How long does it take for labor to be induced? Some inductions can take up to 2-3 days. Depending on the cervix, it usually takes less time. It takes longer when you are induced early in the pregnancy or if this is your first pregnancy. If a mother is still pregnant and the induction has been going on for 2-3 days, either the mother will be sent home or a cesarean delivery will be needed. What are the risks associated with labor induction? Some of the risks of induction include:  Changes in fetal heart rate, such as too high, too low, or erratic.  Fetal distress.    Chance of infection for the mother and baby.  Increased chance of having a cesarean delivery.  Breaking off (abruption) of the placenta from the uterus (rare).  Uterine rupture (very rare). When induction is needed for medical reasons, the benefits of induction may outweigh the risks. What are some reasons for not inducing labor? Labor induction should not be done if:  It is shown that your baby does not tolerate labor.  You have had previous surgeries on your uterus, such as a myomectomy or the removal of  fibroids.  Your placenta lies very low in the uterus and blocks the opening of the cervix (placenta previa).  Your baby is not in a head-down position.  The umbilical cord drops down into the birth canal in front of the baby. This could cut off the baby's blood and oxygen supply.  You have had a previous cesarean delivery.  There are unusual circumstances, such as the baby being extremely premature. This information is not intended to replace advice given to you by your health care provider. Make sure you discuss any questions you have with your health care provider. Document Released: 12/05/2006 Document Revised: 12/22/2015 Document Reviewed: 02/12/2013 Elsevier Interactive Patient Education  2017 Elsevier Inc.  

## 2018-02-04 NOTE — Progress Notes (Signed)
   PRENATAL VISIT NOTE  Subjective:  Kim Haley is a 37 y.o. G2P1001 at 8047w6d being seen today for ongoing prenatal care.  She is currently monitored for the following issues for this high-risk pregnancy and has Obesity affecting pregnancy in second trimester; Antepartum multigravida of advanced maternal age; Elevated blood pressure affecting pregnancy, antepartum; Supervision of high risk pregnancy, antepartum; Anemia of mother in pregnancy, antepartum; Chlamydial cervicitis; Labor and delivery indication for care or intervention; and Chronic hypertension during pregnancy, antepartum on their problem list.  Patient reports occasional contractions.  Contractions: Irregular. Vag. Bleeding: None.  Movement: Present. Denies leaking of fluid.   The following portions of the patient's history were reviewed and updated as appropriate: allergies, current medications, past family history, past medical history, past social history, past surgical history and problem list. Problem list updated.  Objective:   Vitals:   02/04/18 1510  BP: 124/87  Pulse: (!) 130  Weight: (!) 339 lb 6.4 oz (154 kg)    Fetal Status: Fetal Heart Rate (bpm): 147   Movement: Present     General:  Alert, oriented and cooperative. Patient is in no acute distress.  Skin: Skin is warm and dry. No rash noted.   Cardiovascular: Normal heart rate noted  Respiratory: Normal respiratory effort, no problems with respiration noted  Abdomen: Soft, gravid, appropriate for gestational age.  Pain/Pressure: Present     Pelvic: Cervical exam performed        Extremities: Normal range of motion.  Edema: Very deep pitting, indentation lasts a long time  Mental Status: Normal mood and affect. Normal behavior. Normal judgment and thought content.   Assessment and Plan:  Pregnancy: G2P1001 at 2647w6d  1. Supervision of high risk pregnancy, antepartum  2. Obesity affecting pregnancy in second trimester EFW 3124 gram  (6# 14 oz)  Last  pregnancy 8# 12 oz.  3. Elevated blood pressure affecting pregnancy, antepartum  4. Chronic hypertension during pregnancy, antepartum No meds on baby ASA. rec to stop   02/03/2018 BPP 8/8 for IOL in the am   5. Antepartum multigravida of advanced maternal age  436. Anemia of mother in pregnancy, antepartum Cont FeSO4  Term labor symptoms and general obstetric precautions including but not limited to vaginal bleeding, contractions, leaking of fluid and fetal movement were reviewed in detail with the patient. Please refer to After Visit Summary for other counseling recommendations.  Return in about 1 month (around 03/04/2018).  Future Appointments  Date Time Provider Department Center  02/05/2018  7:30 AM [facility] ROOM [facility] None    Willodean Rosenthalarolyn Harraway-Smith, MD

## 2018-02-05 ENCOUNTER — Inpatient Hospital Stay (HOSPITAL_COMMUNITY): Payer: 59 | Admitting: Anesthesiology

## 2018-02-05 ENCOUNTER — Encounter (HOSPITAL_COMMUNITY): Payer: Self-pay

## 2018-02-05 ENCOUNTER — Inpatient Hospital Stay (HOSPITAL_COMMUNITY)
Admission: RE | Admit: 2018-02-05 | Discharge: 2018-02-08 | DRG: 807 | Disposition: A | Discharge status: Home / Self Care | Payer: 59 | Source: Ambulatory Visit | Attending: Obstetrics & Gynecology | Admitting: Obstetrics & Gynecology

## 2018-02-05 DIAGNOSIS — O99214 Obesity complicating childbirth: Secondary | ICD-10-CM | POA: Diagnosis present

## 2018-02-05 DIAGNOSIS — O0990 Supervision of high risk pregnancy, unspecified, unspecified trimester: Secondary | ICD-10-CM

## 2018-02-05 DIAGNOSIS — O99019 Anemia complicating pregnancy, unspecified trimester: Secondary | ICD-10-CM

## 2018-02-05 DIAGNOSIS — Z7982 Long term (current) use of aspirin: Secondary | ICD-10-CM | POA: Diagnosis not present

## 2018-02-05 DIAGNOSIS — Z3A39 39 weeks gestation of pregnancy: Secondary | ICD-10-CM

## 2018-02-05 DIAGNOSIS — E669 Obesity, unspecified: Secondary | ICD-10-CM | POA: Diagnosis not present

## 2018-02-05 DIAGNOSIS — O1092 Unspecified pre-existing hypertension complicating childbirth: Secondary | ICD-10-CM | POA: Diagnosis not present

## 2018-02-05 DIAGNOSIS — A5609 Other chlamydial infection of lower genitourinary tract: Secondary | ICD-10-CM

## 2018-02-05 DIAGNOSIS — O10919 Unspecified pre-existing hypertension complicating pregnancy, unspecified trimester: Secondary | ICD-10-CM | POA: Diagnosis present

## 2018-02-05 DIAGNOSIS — O169 Unspecified maternal hypertension, unspecified trimester: Secondary | ICD-10-CM

## 2018-02-05 DIAGNOSIS — O09523 Supervision of elderly multigravida, third trimester: Secondary | ICD-10-CM | POA: Diagnosis not present

## 2018-02-05 DIAGNOSIS — O99212 Obesity complicating pregnancy, second trimester: Secondary | ICD-10-CM

## 2018-02-05 DIAGNOSIS — O09529 Supervision of elderly multigravida, unspecified trimester: Secondary | ICD-10-CM

## 2018-02-05 LAB — CBC
HEMATOCRIT: 33.7 % — AB (ref 36.0–46.0)
HEMATOCRIT: 31.8 % — AB (ref 36.0–46.0)
HEMOGLOBIN: 10.9 g/dL — AB (ref 12.0–15.0)
HEMOGLOBIN: 10.4 g/dL — AB (ref 12.0–15.0)
MCH: 28.5 pg (ref 26.0–34.0)
MCH: 29 pg (ref 26.0–34.0)
MCHC: 32.3 g/dL (ref 30.0–36.0)
MCHC: 32.7 g/dL (ref 30.0–36.0)
MCV: 88.2 fL (ref 78.0–100.0)
MCV: 88.6 fL (ref 78.0–100.0)
Platelets: 219 10*3/uL (ref 150–400)
Platelets: 200 10*3/uL (ref 150–400)
RBC: 3.82 MIL/uL — AB (ref 3.87–5.11)
RBC: 3.59 MIL/uL — AB (ref 3.87–5.11)
RDW: 16.4 % — ABNORMAL HIGH (ref 11.5–15.5)
RDW: 16.5 % — ABNORMAL HIGH (ref 11.5–15.5)
WBC: 11.2 10*3/uL — ABNORMAL HIGH (ref 4.0–10.5)
WBC: 12.6 10*3/uL — AB (ref 4.0–10.5)

## 2018-02-05 LAB — COMPREHENSIVE METABOLIC PANEL
ALK PHOS: 94 U/L (ref 38–126)
ALT: 11 U/L (ref 0–44)
ANION GAP: 9 (ref 5–15)
AST: 24 U/L (ref 15–41)
Albumin: 2.4 g/dL — ABNORMAL LOW (ref 3.5–5.0)
BUN: 9 mg/dL (ref 6–20)
CALCIUM: 7.8 mg/dL — AB (ref 8.9–10.3)
CO2: 18 mmol/L — ABNORMAL LOW (ref 22–32)
Chloride: 105 mmol/L (ref 98–111)
Creatinine, Ser: 0.52 mg/dL (ref 0.44–1.00)
GLUCOSE: 106 mg/dL — AB (ref 70–99)
POTASSIUM: 3.5 mmol/L (ref 3.5–5.1)
Sodium: 132 mmol/L — ABNORMAL LOW (ref 135–145)
Total Bilirubin: 0.3 mg/dL (ref 0.3–1.2)
Total Protein: 5.5 g/dL — ABNORMAL LOW (ref 6.5–8.1)

## 2018-02-05 LAB — TYPE AND SCREEN
ABO/RH(D): O POS
ANTIBODY SCREEN: NEGATIVE

## 2018-02-05 LAB — RPR: RPR Ser Ql: NONREACTIVE

## 2018-02-05 LAB — PROTEIN / CREATININE RATIO, URINE
CREATININE, URINE: 76 mg/dL
Protein Creatinine Ratio: 0.13 mg/mg{Cre} (ref 0.00–0.15)
TOTAL PROTEIN, URINE: 10 mg/dL

## 2018-02-05 LAB — ABO/RH: ABO/RH(D): O POS

## 2018-02-05 LAB — OB RESULTS CONSOLE GBS: GBS: NEGATIVE

## 2018-02-05 LAB — GROUP B STREP BY PCR: GROUP B STREP BY PCR: NEGATIVE

## 2018-02-05 MED ORDER — OXYTOCIN 40 UNITS IN LACTATED RINGERS INFUSION - SIMPLE MED
1.0000 m[IU]/min | INTRAVENOUS | Status: DC
  Administered 2018-02-05: 4 m[IU]/min via INTRAVENOUS
  Administered 2018-02-05: 2 m[IU]/min via INTRAVENOUS

## 2018-02-05 MED ORDER — FLEET ENEMA 7-19 GM/118ML RE ENEM: 1.0000 | ENEMA | RECTAL | Status: DC | PRN

## 2018-02-05 MED ORDER — OXYCODONE-ACETAMINOPHEN 5-325 MG PO TABS: 2.0000 | ORAL_TABLET | ORAL | Status: DC | PRN

## 2018-02-05 MED ORDER — LIDOCAINE HCL (PF) 1 % IJ SOLN
INTRAMUSCULAR | Status: DC | PRN
  Administered 2018-02-05: 13 mL via EPIDURAL

## 2018-02-05 MED ORDER — MISOPROSTOL 25 MCG QUARTER TABLET
25.0000 ug | ORAL_TABLET | ORAL | Status: DC | PRN
  Administered 2018-02-05 (×2): 25 ug via VAGINAL
  Filled 2018-02-05 (×2): qty 1

## 2018-02-05 MED ORDER — OXYCODONE-ACETAMINOPHEN 5-325 MG PO TABS: 1.0000 | ORAL_TABLET | ORAL | Status: DC | PRN

## 2018-02-05 MED ORDER — DIPHENHYDRAMINE HCL 50 MG/ML IJ SOLN: 12.5000 mg | INTRAMUSCULAR | Status: DC | PRN

## 2018-02-05 MED ORDER — LACTATED RINGERS IV SOLN
500.0000 mL | INTRAVENOUS | Status: DC | PRN
  Administered 2018-02-06: 500 mL via INTRAVENOUS

## 2018-02-05 MED ORDER — EPHEDRINE 5 MG/ML INJ
10.0000 mg | INTRAVENOUS | Status: DC | PRN
  Filled 2018-02-05: qty 2

## 2018-02-05 MED ORDER — LACTATED RINGERS IV SOLN
INTRAVENOUS | Status: DC
  Administered 2018-02-05 – 2018-02-06 (×6): via INTRAVENOUS

## 2018-02-05 MED ORDER — LIDOCAINE HCL (PF) 1 % IJ SOLN
30.0000 mL | INTRAMUSCULAR | Status: AC | PRN
  Administered 2018-02-06: 30 mL via SUBCUTANEOUS
  Filled 2018-02-05: qty 30

## 2018-02-05 MED ORDER — OXYTOCIN BOLUS FROM INFUSION
500.0000 mL | Freq: Once | INTRAVENOUS | Status: AC
  Administered 2018-02-06: 500 mL via INTRAVENOUS

## 2018-02-05 MED ORDER — PHENYLEPHRINE 40 MCG/ML (10ML) SYRINGE FOR IV PUSH (FOR BLOOD PRESSURE SUPPORT)
80.0000 ug | PREFILLED_SYRINGE | INTRAVENOUS | Status: DC | PRN
  Filled 2018-02-05: qty 10
  Filled 2018-02-05: qty 5

## 2018-02-05 MED ORDER — FENTANYL CITRATE (PF) 100 MCG/2ML IJ SOLN
100.0000 ug | INTRAMUSCULAR | Status: DC | PRN
  Administered 2018-02-05 (×3): 100 ug via INTRAVENOUS
  Filled 2018-02-05 (×3): qty 2

## 2018-02-05 MED ORDER — FENTANYL 2.5 MCG/ML BUPIVACAINE 1/10 % EPIDURAL INFUSION (WH - ANES)
14.0000 mL/h | INTRAMUSCULAR | Status: DC | PRN
  Administered 2018-02-05 – 2018-02-06 (×3): 14 mL/h via EPIDURAL
  Filled 2018-02-05 (×3): qty 100

## 2018-02-05 MED ORDER — MISOPROSTOL 50MCG HALF TABLET
50.0000 ug | ORAL_TABLET | ORAL | Status: DC | PRN
  Filled 2018-02-05: qty 1

## 2018-02-05 MED ORDER — ACETAMINOPHEN 325 MG PO TABS
650.0000 mg | ORAL_TABLET | ORAL | Status: DC | PRN
  Administered 2018-02-06: 650 mg via ORAL
  Filled 2018-02-05: qty 2

## 2018-02-05 MED ORDER — SOD CITRATE-CITRIC ACID 500-334 MG/5ML PO SOLN: 30.0000 mL | ORAL | Status: DC | PRN

## 2018-02-05 MED ORDER — OXYTOCIN 40 UNITS IN LACTATED RINGERS INFUSION - SIMPLE MED
2.5000 [IU]/h | INTRAVENOUS | Status: DC
  Filled 2018-02-05: qty 1000

## 2018-02-05 MED ORDER — ONDANSETRON HCL 4 MG/2ML IJ SOLN: 4.0000 mg | Freq: Four times a day (QID) | INTRAMUSCULAR | Status: DC | PRN

## 2018-02-05 MED ORDER — LACTATED RINGERS IV SOLN
500.0000 mL | Freq: Once | INTRAVENOUS | Status: AC
  Administered 2018-02-05: 500 mL via INTRAVENOUS

## 2018-02-05 MED ORDER — PHENYLEPHRINE 40 MCG/ML (10ML) SYRINGE FOR IV PUSH (FOR BLOOD PRESSURE SUPPORT)
80.0000 ug | PREFILLED_SYRINGE | INTRAVENOUS | Status: DC | PRN
  Filled 2018-02-05: qty 5

## 2018-02-05 MED ORDER — TERBUTALINE SULFATE 1 MG/ML IJ SOLN
0.2500 mg | Freq: Once | INTRAMUSCULAR | Status: DC | PRN
  Filled 2018-02-05: qty 1

## 2018-02-05 NOTE — Anesthesia Pain Management Evaluation Note (Signed)
  [facility] Pain Management Visit Note  Patient: Kim Haley, 37 y.o., female  "Hello I am a member of the anesthesia [facility] at [facility]. We have an anesthesia team available at all times to provide care throughout the hospital, including epidural management and anesthesia for C-section. I don't know your plan for the delivery whether it a natural birth, water birth, IV sedation, nitrous supplementation, doula or epidural, but we want to meet your pain goals."   1.Was your pain managed to your expectations on prior hospitalizations?   No   2.What is your expectation for pain management during this hospitalization?     Epidural  3.How can we help you reach that goal? epidural  Record the patient's initial score and the patient's pain goal.   Pain: 2  Pain Goal: 3 [facility] wants you to be able to say your pain was always managed very well.  Edison PaceWILKERSON,Idabelle Mcpeters 02/05/2018

## 2018-02-05 NOTE — H&P (Addendum)
OBSTETRIC ADMISSION HISTORY AND PHYSICAL  Kim Haley is a 37 y.o. female G2P1001 with IUP at [redacted]w[redacted]d by 6 week U/S presenting for induction of labor for chronic HTN (no medication control) . Her pregnancy is also complicated by obesity (BMI 55), advanced maternal age.  She reports +FMs, No LOF, no VB, no blurry vision, headaches or peripheral edema, no RUQ pain.  She reports one episode of vomiting two days ago after breakfast and a persistent cough throughout her third trimester. She plans on breast feeding. She is undecided for birth control. She received her [facility] to [facility]   Estimated Date of Delivery: 02/12/18  Sono:    Prenatal History/Complications:  Past Medical History: Past Medical History:  Diagnosis Date  . Complication of anesthesia    woke up during surgery and epidural was one sided  . Morbidly obese (HCC)   . Pregnancy induced hypertension     Past Surgical History: Past Surgical History:  Procedure Laterality Date  . WISDOM TOOTH EXTRACTION      Obstetrical History: OB History    Gravida  2   Para  1   Term  1   Preterm      AB      Living  1     SAB      TAB      Ectopic      Multiple      Live Births  1           Social History: Social History   Socioeconomic History  . Marital status: Married    Spouse name: Not on file  . Number of children: Not on file  . Years of education: Not on file  . Highest education level: Not on file  Occupational History  . Not on file  Social Needs  . Financial resource strain: Not on file  . Food insecurity:    Worry: Not on file    Inability: Not on file  . Transportation needs:    Medical: Not on file    Non-medical: Not on file  Tobacco Use  . Smoking status: Never Smoker  . Smokeless tobacco: Never Used  Substance and Sexual Activity  . Alcohol use: No    Alcohol/week: 0.0 oz  . Drug use: No  . Sexual activity: Yes    Birth  control/protection: None  Lifestyle  . Physical activity:    Days per week: Not on file    Minutes per session: Not on file  . Stress: Not on file  Relationships  . Social connections:    Talks on phone: Not on file    Gets together: Not on file    Attends religious service: Not on file    Active member of club or organization: Not on file    Attends meetings of clubs or organizations: Not on file    Relationship status: Not on file  Other Topics Concern  . Not on file  Social History Narrative  . Not on file    Family History: Family History  Problem Relation Age of Onset  . Cancer Maternal Aunt     Allergies: No Known Allergies  Medications Prior to Admission  Medication Sig Dispense Refill Last Dose  . Ascorbic Acid (VITA-C PO) Take 1 tablet by mouth daily.    02/04/2018 at Unknown time  . aspirin EC 81 MG tablet Take 81 mg by mouth daily.    02/04/2018 at Unknown  time  . calcium carbonate (TUMS - DOSED IN MG ELEMENTAL CALCIUM) 500 MG chewable tablet Chew 1 tablet by mouth 2 (two) times daily as needed for indigestion or heartburn.   02/04/2018 at Unknown time  . diphenhydrAMINE (BENADRYL ALLERGY) 25 MG tablet Take 25 mg by mouth daily as needed for itching (rash).    Past Week at Unknown time  . ferrous sulfate 325 (65 FE) MG tablet Take 325 mg by mouth 2 (two) times daily with a meal.    02/04/2018 at Unknown time  . Prenatal Vit-Fe Fumarate-FA (MULTIVITAMIN-PRENATAL) 27-0.8 MG TABS tablet Take 1 tablet by mouth daily at 12 noon.   02/04/2018 at Unknown time     Review of Systems   All systems reviewed and negative except as stated in HPI  Blood pressure (!) 143/95, pulse (!) 115, temperature 98 F (36.7 C), temperature source Oral, resp. rate 18, height 5\' 6"  (1.676 m), weight (!) 153.8 kg (339 lb), last menstrual period 05/08/2017. General appearance: alert, cooperative and no distress Lungs: clear to auscultation bilaterally Heart: regular rate and rhythm Abdomen:  gravid; bowel sounds normal Extremities: Homans sign is negative, no sign of DVT Presentation: cephalic Fetal monitoringBaseline: 145 bpm, Variability: Good {> 6 bpm), Accelerations: Reactive and Decelerations: Absent Uterine activityIntensity: mild and every 3-4 minutes Dilation: 1.5 Effacement (%): 50 Station: -3 Exam by:: Earlene Plater, RN   Prenatal labs: ABO, Rh: --/--/O POS (07/10 9604) Antibody: NEG (07/10 0810) Rubella: 3.84 (11/16 0902) RPR: Non Reactive (11/16 0902)  HBsAg: Negative (11/16 0902)  HIV: Non Reactive (11/16 0902)  GBS:   PCR pending 1 hr Glucola 174 Genetic screening  NIPS - normal Anatomy US normal  Prenatal Transfer Tool  Maternal Diabetes: No Genetic Screening: Normal Maternal Ultrasounds/Referrals: Normal Fetal Ultrasounds or other Referrals:  Other: BPP 8/8 on 02/03/18 Maternal Substance Abuse:  No Significant Maternal Medications:  None. Pt takes baby aspirin daily.  Significant Maternal Lab Results: Lab values include: Group B Strep negative  Results for orders placed or performed during the hospital encounter of 02/05/18 (from the past 24 hour(s))  Group B strep by PCR   Collection Time: 02/05/18  8:01 AM  Result Value Ref Range   Group B strep by PCR NEGATIVE NEGATIVE  Protein / creatinine ratio, urine   Collection Time: 02/05/18  8:01 AM  Result Value Ref Range   Creatinine, Urine 76.00 mg/dL   Total Protein, Urine 10 mg/dL   Protein Creatinine Ratio 0.13 0.00 - 0.15 mg/mg[Cre]  CBC   Collection Time: 02/05/18  8:10 AM  Result Value Ref Range   WBC 11.2 (H) 4.0 - 10.5 K/uL   RBC 3.82 (L) 3.87 - 5.11 MIL/uL   Hemoglobin 10.9 (L) 12.0 - 15.0 g/dL   HCT 54.0 (L) 98.1 - 19.1 %   MCV 88.2 78.0 - 100.0 fL   MCH 28.5 26.0 - 34.0 pg   MCHC 32.3 30.0 - 36.0 g/dL   RDW 47.8 (H) 29.5 - 62.1 %   Platelets 219 150 - 400 K/uL  Comprehensive metabolic panel   Collection Time: 02/05/18  8:10 AM  Result Value Ref Range   Sodium 132 (L) 135 - 145 mmol/L    Potassium 3.5 3.5 - 5.1 mmol/L   Chloride 105 98 - 111 mmol/L   CO2 18 (L) 22 - 32 mmol/L   Glucose, Bld 106 (H) 70 - 99 mg/dL   BUN 9 6 - 20 mg/dL   Creatinine, Ser 3.08 0.44 - 1.00 mg/dL  Calcium 7.8 (L) 8.9 - 10.3 mg/dL   Total Protein 5.5 (L) 6.5 - 8.1 g/dL   Albumin 2.4 (L) 3.5 - 5.0 g/dL   AST 24 15 - 41 U/L   ALT 11 0 - 44 U/L   Alkaline Phosphatase 94 38 - 126 U/L   Total Bilirubin 0.3 0.3 - 1.2 mg/dL   GFR calc non Af Amer >60 >60 mL/min   GFR calc Af Amer >60 >60 mL/min   Anion gap 9 5 - 15  Type and screen [facility]   Collection Time: 02/05/18  8:10 AM  Result Value Ref Range   ABO/RH(D) O POS    Antibody Screen NEG    Sample Expiration      02/08/2018 Performed at [facility], [address] [place], [place] [address]     Patient Active Problem List   Diagnosis Date Noted  . Preexisting hypertension complicating pregnancy, antepartum 02/05/2018  . Chronic hypertension during pregnancy, antepartum 01/29/2018  . Labor and delivery indication for care or intervention 01/02/2018  . Anemia of mother in pregnancy, antepartum 06/18/2017  . Chlamydial cervicitis 06/18/2017  . Obesity affecting pregnancy in second trimester 06/14/2017  . Antepartum multigravida of advanced maternal age 48/16/2018  . Elevated blood pressure affecting pregnancy, antepartum 06/14/2017  . Supervision of high risk pregnancy, antepartum 06/14/2017    Assessment/Plan:  Lacey Jensenmilie P Yacoub is a 37 y.o. G2P1001 at 5130w0d here for IOL for chronic HTN not being controlled by medication.  Her pregnancy is also complicated by advanced maternal age, obesity (BMI > 55).   #Labor:induction: cytotec, will consider foley bulb and pitocin as pt progresses  # HTN - will continue to monitor BP levels. Not currently having signs/symptoms of pre E.  #Pain: Epidural per pt request  #FWB: Cat I #ID:  Group B negative #MOF: breast #MOC:undecided  Sandre Kittyaniel K Olson, MD  02/05/2018,  11:10 AM  I confirm that I have verified the information documented in the resident's note and that I have also personally reperformed the physical exam and all medical decision making activities.   Thressa ShellerHeather Hogan 11:51 AM 02/05/18

## 2018-02-05 NOTE — Anesthesia Procedure Notes (Signed)
Epidural Patient location during procedure: OB Start time: 02/05/2018 10:41 PM End time: 02/05/2018 10:56 PM  Staffing Anesthesiologist: Lowella CurbMiller, Madiha Bambrick Ray, MD Performed: anesthesiologist   Preanesthetic Checklist Completed: patient identified, site marked, surgical consent, pre-op evaluation, timeout performed, IV checked, risks and benefits discussed and monitors and equipment checked  Epidural Patient position: sitting Prep: ChloraPrep Patient monitoring: heart rate, cardiac monitor, continuous pulse ox and blood pressure Approach: midline Location: L2-L3 Injection technique: LOR saline  Needle:  Needle type: Tuohy  Needle gauge: 17 G Needle length: 9 cm Needle insertion depth: 9 cm Catheter type: closed end flexible Catheter size: 20 Guage Catheter at skin depth: 15 cm Test dose: negative  Assessment Events: blood not aspirated, injection not painful, no injection resistance, negative IV test and no paresthesia  Additional Notes Reason for block:procedure for pain

## 2018-02-05 NOTE — Progress Notes (Signed)
Patient ID: Kim Haley Weatherman, female   DOB: 05/02/1981, 37 y.o.   MRN: [id number]  Labor Progress Note Kim Haley Haidar is a 37 y.o. G2P1001 at 5632w0d presented for IOL for cHTN S: patient states her pain has increased w/ contractions to '4/10'. Foley bulb was placed at about 1630.    O:  BP 138/85   Pulse (!) 110   Temp 98.3 F (36.8 C) (Oral)   Resp 18   Ht 5\' 6"  (1.676 m)   Wt (!) 153.8 kg (339 lb)   LMP 05/08/2017   BMI 54.72 kg/m  EFM: 145/moderate variablity/present accels  CVE: Dilation: 1.5 Effacement (%): 50 Station: -3 Presentation: Vertex Exam by:: Dr. Nira Retortegele   A&Haley: 37 y.o. G2P1001 1432w0d for IOL for cHTN #Labor: second dose given at 1300. Foley bulb placed at 1630.  #Pain: per mother request #FWB: cat. I #GBS negative #chronic HTN - monitoring BP.   Sandre Kittyaniel K Olson, MD 4:56 PM

## 2018-02-05 NOTE — Anesthesia Preprocedure Evaluation (Signed)
Anesthesia Evaluation  Patient identified by MRN, date of birth, ID band Patient awake    Reviewed: Allergy & Precautions, NPO status , Patient's Chart, lab work & pertinent test results  Airway Mallampati: II  TM Distance: >3 FB Neck ROM: Full    Dental no notable dental hx.    Pulmonary neg pulmonary ROS,    Pulmonary exam normal breath sounds clear to auscultation       Cardiovascular hypertension, negative cardio ROS Normal cardiovascular exam Rhythm:Regular Rate:Normal     Neuro/Psych negative neurological ROS  negative psych ROS   GI/Hepatic negative GI ROS, Neg liver ROS,   Endo/Other  negative endocrine ROSMorbid obesity  Renal/GU negative Renal ROS  negative genitourinary   Musculoskeletal negative musculoskeletal ROS (+)   Abdominal (+) + obese,   Peds negative pediatric ROS (+)  Hematology negative hematology ROS (+)   Anesthesia Other Findings   Reproductive/Obstetrics negative OB ROS (+) Pregnancy                             Anesthesia Physical Anesthesia Plan  ASA: III  Anesthesia Plan: Epidural   Post-op Pain Management:    Induction:   PONV Risk Score and Plan:   Airway Management Planned:   Additional Equipment:   Intra-op Plan:   Post-operative Plan:   Informed Consent:   Plan Discussed with:   Anesthesia Plan Comments:         Anesthesia Quick Evaluation

## 2018-02-05 NOTE — Progress Notes (Signed)
  Checked on patient.  Foley bulb is out.  Will start pitocin now. Patient has no complaints.  Politely requested that males don't perform cervical checks on her.

## 2018-02-05 NOTE — Plan of Care (Signed)
POC discussed with resident and patient.  Foley bulb out and pitocin started.  Discussed induction process and pain control options with patient and husband.  Will continue to monitor and communicate with team.

## 2018-02-05 NOTE — Progress Notes (Signed)
Patient ID: Kim Haley, female   DOB: 17-Nov-1980, 37 y.o.   MRN: [id number] Doing well.    Vitals:   02/05/18 1702 02/05/18 1801 02/05/18 1901 02/05/18 2002  BP: 140/81 (!) 140/96 (!) 140/93 (!) 137/98  Pulse: (!) 101 (!) 118 (!) 112 (!) 124  Resp:  18  18  Temp:  98.4 F (36.9 C)  98.1 F (36.7 C)  TempSrc:  Oral  Oral  Weight:      Height:       FHR stable  UCs irregular  .Dilation: 4 Effacement (%): 50 Station: -2 Presentation: Vertex Exam by:: K Faucett RN  Consider AROM and internals

## 2018-02-06 ENCOUNTER — Encounter (HOSPITAL_COMMUNITY): Payer: Self-pay

## 2018-02-06 ENCOUNTER — Other Ambulatory Visit: Payer: Self-pay

## 2018-02-06 DIAGNOSIS — Z3A39 39 weeks gestation of pregnancy: Secondary | ICD-10-CM

## 2018-02-06 DIAGNOSIS — O1092 Unspecified pre-existing hypertension complicating childbirth: Secondary | ICD-10-CM

## 2018-02-06 DIAGNOSIS — E669 Obesity, unspecified: Secondary | ICD-10-CM

## 2018-02-06 DIAGNOSIS — O09523 Supervision of elderly multigravida, third trimester: Secondary | ICD-10-CM

## 2018-02-06 DIAGNOSIS — O99214 Obesity complicating childbirth: Secondary | ICD-10-CM

## 2018-02-06 LAB — CBC
HCT: 35.1 % — ABNORMAL LOW (ref 36.0–46.0)
HEMOGLOBIN: 11.3 g/dL — AB (ref 12.0–15.0)
MCH: 28.3 pg (ref 26.0–34.0)
MCHC: 32.2 g/dL (ref 30.0–36.0)
MCV: 88 fL (ref 78.0–100.0)
Platelets: 204 10*3/uL (ref 150–400)
RBC: 3.99 MIL/uL (ref 3.87–5.11)
RDW: 16.3 % — ABNORMAL HIGH (ref 11.5–15.5)
WBC: 18.3 10*3/uL — ABNORMAL HIGH (ref 4.0–10.5)

## 2018-02-06 LAB — COMPREHENSIVE METABOLIC PANEL
ALK PHOS: 103 U/L (ref 38–126)
ALT: 12 U/L (ref 0–44)
ANION GAP: 9 (ref 5–15)
AST: 28 U/L (ref 15–41)
Albumin: 2.2 g/dL — ABNORMAL LOW (ref 3.5–5.0)
BILIRUBIN TOTAL: 0.5 mg/dL (ref 0.3–1.2)
BUN: 10 mg/dL (ref 6–20)
CALCIUM: 8.1 mg/dL — AB (ref 8.9–10.3)
CO2: 19 mmol/L — AB (ref 22–32)
CREATININE: 0.75 mg/dL (ref 0.44–1.00)
Chloride: 107 mmol/L (ref 98–111)
Glucose, Bld: 97 mg/dL (ref 70–99)
Potassium: 4 mmol/L (ref 3.5–5.1)
SODIUM: 135 mmol/L (ref 135–145)
TOTAL PROTEIN: 5.2 g/dL — AB (ref 6.5–8.1)

## 2018-02-06 LAB — PROTEIN / CREATININE RATIO, URINE
CREATININE, URINE: 68 mg/dL
PROTEIN CREATININE RATIO: 0.26 mg/mg{creat} — AB (ref 0.00–0.15)
TOTAL PROTEIN, URINE: 18 mg/dL

## 2018-02-06 MED ORDER — TRANEXAMIC ACID 1000 MG/10ML IV SOLN: 1000.0000 mg | Freq: Once | INTRAVENOUS | Status: DC

## 2018-02-06 MED ORDER — SENNOSIDES-DOCUSATE SODIUM 8.6-50 MG PO TABS
2.0000 | ORAL_TABLET | ORAL | Status: DC
  Administered 2018-02-06 – 2018-02-07 (×2): 2 via ORAL
  Filled 2018-02-06 (×2): qty 2

## 2018-02-06 MED ORDER — DIBUCAINE 1 % RE OINT: 1.0000 "application " | TOPICAL_OINTMENT | RECTAL | Status: DC | PRN

## 2018-02-06 MED ORDER — WITCH HAZEL-GLYCERIN EX PADS: 1.0000 "application " | MEDICATED_PAD | CUTANEOUS | Status: DC | PRN

## 2018-02-06 MED ORDER — BENZOCAINE-MENTHOL 20-0.5 % EX AERO
1.0000 "application " | INHALATION_SPRAY | CUTANEOUS | Status: DC | PRN
  Administered 2018-02-06: 1 via TOPICAL
  Filled 2018-02-06: qty 56

## 2018-02-06 MED ORDER — DIPHENHYDRAMINE HCL 25 MG PO CAPS: 25.0000 mg | ORAL_CAPSULE | Freq: Four times a day (QID) | ORAL | Status: DC | PRN

## 2018-02-06 MED ORDER — COCONUT OIL OIL: 1.0000 "application " | TOPICAL_OIL | Status: DC | PRN

## 2018-02-06 MED ORDER — IBUPROFEN 600 MG PO TABS
600.0000 mg | ORAL_TABLET | Freq: Four times a day (QID) | ORAL | Status: DC
  Administered 2018-02-06 – 2018-02-08 (×7): 600 mg via ORAL
  Filled 2018-02-06 (×7): qty 1

## 2018-02-06 MED ORDER — SODIUM CHLORIDE 0.9 % IV SOLN
INTRAVENOUS | Status: AC
  Filled 2018-02-06: qty 10

## 2018-02-06 MED ORDER — ONDANSETRON HCL 4 MG/2ML IJ SOLN: 4.0000 mg | INTRAMUSCULAR | Status: DC | PRN

## 2018-02-06 MED ORDER — DEXTROSE 5 % IV SOLN
3.0000 g | Freq: Once | INTRAVENOUS | Status: AC
  Administered 2018-02-06: 3 g via INTRAVENOUS
  Filled 2018-02-06: qty 3

## 2018-02-06 MED ORDER — LACTATED RINGERS IV SOLN
INTRAVENOUS | Status: DC
  Administered 2018-02-06 (×2): via INTRAUTERINE

## 2018-02-06 MED ORDER — BUTALBITAL-APAP-CAFFEINE 50-325-40 MG PO TABS
1.0000 | ORAL_TABLET | Freq: Once | ORAL | Status: AC
  Administered 2018-02-06: 1 via ORAL
  Filled 2018-02-06: qty 1

## 2018-02-06 MED ORDER — TETANUS-DIPHTH-ACELL PERTUSSIS 5-2.5-18.5 LF-MCG/0.5 IM SUSP: 0.5000 mL | Freq: Once | INTRAMUSCULAR | Status: DC

## 2018-02-06 MED ORDER — PRENATAL MULTIVITAMIN CH
1.0000 | ORAL_TABLET | Freq: Every day | ORAL | Status: DC
  Administered 2018-02-07: 1 via ORAL
  Filled 2018-02-06: qty 1

## 2018-02-06 MED ORDER — MISOPROSTOL 200 MCG PO TABS
ORAL_TABLET | ORAL | Status: AC
  Filled 2018-02-06: qty 5

## 2018-02-06 MED ORDER — SIMETHICONE 80 MG PO CHEW: 80.0000 mg | CHEWABLE_TABLET | ORAL | Status: DC | PRN

## 2018-02-06 MED ORDER — ZOLPIDEM TARTRATE 5 MG PO TABS: 5.0000 mg | ORAL_TABLET | Freq: Every evening | ORAL | Status: DC | PRN

## 2018-02-06 MED ORDER — ONDANSETRON HCL 4 MG PO TABS: 4.0000 mg | ORAL_TABLET | ORAL | Status: DC | PRN

## 2018-02-06 MED ORDER — MISOPROSTOL 200 MCG PO TABS
800.0000 ug | ORAL_TABLET | Freq: Once | ORAL | Status: AC
  Administered 2018-02-06: 1000 ug via RECTAL

## 2018-02-06 MED ORDER — ACETAMINOPHEN 325 MG PO TABS: 650.0000 mg | ORAL_TABLET | ORAL | Status: DC | PRN

## 2018-02-06 NOTE — Progress Notes (Signed)
Patient ID: Kim Haley, female   DOB: April 05, 1981, 37 y.o.   MRN: [id number] Comfortable with epidural   Vitals:   02/06/18 0132 02/06/18 0202 02/06/18 0232 02/06/18 0302  BP: 133/78 (!) 107/58 (!) 110/59 (!) 128/58  Pulse: (!) 117 (!) 118 (!) 114 (!) 116  Resp: 18     Temp: 98.7 F (37.1 C)     TempSrc: Oral     SpO2:      Weight:      Height:       FHR reassuring UCs every 2-3 min Dilation: 5.5 Effacement (%): 70 Cervical Position: Middle Station: -3, -2 Presentation: Vertex Exam by:: Rumball   Continue to observe

## 2018-02-06 NOTE — Anesthesia Postprocedure Evaluation (Signed)
Anesthesia Post Note  Patient: Kim JensenEmilie P Haley  Procedure(s) Performed: AN AD HOC LABOR EPIDURAL     Patient location during evaluation: Mother Baby Anesthesia Type: Epidural Level of consciousness: awake and alert and oriented Pain management: satisfactory to patient Vital Signs Assessment: post-procedure vital signs reviewed and stable Respiratory status: spontaneous breathing and nonlabored ventilation Cardiovascular status: stable Postop Assessment: no headache, no backache, no signs of nausea or vomiting, adequate PO intake, patient able to bend at knees and epidural receding (patient up walking) Anesthetic complications: no    Last Vitals:  Vitals:   02/06/18 1615 02/06/18 1753  BP: 136/78 (!) 149/80  Pulse: (!) 122 (!) 115  Resp: 18 18  Temp:  37 C  SpO2:  97%    Last Pain:  Vitals:   02/06/18 1753  TempSrc: Oral  PainSc:    Pain Goal:                 Madison HickmanGREGORY,Joniece Smotherman

## 2018-02-06 NOTE — Lactation Note (Signed)
This note was copied from a baby's chart. Lactation Consultation Note  Patient Name: Kim Haley XBJYN'WToday's Date: 02/06/2018 Reason for consult: Initial assessment;Term P2, LGA 10 lbs. Mom attempted feed first baby 1 month but stopped d/t sore nipples. Mom is experience soreness and desired help with [facility]. Mom w/ large  breast is soft with long  short shaft everted nipples.  Breast shells and hand pump given.  Reverse pressure to areola edema demonstrated.  Discussed hand expression, pre-pumping and using breast shells. LC assisted Mom with  Latch using the  football hold on right breast. Infant did lick and  suckle at breast but had mucous/ emesis that was frothy.  Newborn behaviors discussed. 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   Baby has recess jaw, tight frenulum, thick labial frenulum. Mom  c/o slight pain with feeding . Encourage mom to discuss infant oral anatomy with MD.    [facility] pamph[facility] given with resources.   Maternal Data    Feeding Feeding Type: Breast Fed Length of feed: 0 min(attempted )  LATCH Score Latch: Repeated attempts needed to sustain latch, nipple held in mouth throughout feeding, stimulation needed to elicit sucking reflex.  Audible Swallowing: None  Type of Nipple: Everted at rest and after stimulation  Comfort (Breast/Nipple): Soft / non-tender  Hold (Positioning): Assistance needed to correctly position infant at breast and maintain latch.  LATCH Score: 6  Interventions Interventions: Breast feeding basics reviewed;Support pillows;Assisted with latch;Skin to skin;Expressed milk;Breast massage;Shells;Hand express;Breast compression;Adjust position  Lactation Tools Discussed/Used Tools: Shells;Pump Breast pump type: Manual WIC Program: No Pump Review: Setup, frequency, and cleaning Initiated by:: Danelle Earthlyobin Jabree Rebert, IBCLC Date initiated:: 02/06/18   Consult Status Consult Status:  Follow-up Date: 02/07/18 Follow-up type: In-patient    Danelle EarthlyRobin [place] 02/06/2018, 11:59 PM

## 2018-02-06 NOTE — Progress Notes (Signed)
Patient ID: Kim Haley, female   DOB: Mar 10, 1981, 37 y.o.   MRN: [id number] Vitals:   02/06/18 0202 02/06/18 0232 02/06/18 0302 02/06/18 0332  BP: (!) 107/58 (!) 110/59 (!) 128/58 132/63  Pulse: (!) 118 (!) 114 (!) 116 (!) 109  Resp:      Temp:      TempSrc:      SpO2:      Weight:      Height:       FHR reassuring with accels UCs every 2 hrs Dilation: 4 Effacement (%): 70 Cervical Position: Middle Station: Ballotable Presentation: Vertex Exam by:: Hilda LiasMarie, CNM  Turned to other side.  Vertex is too ballotable to rupture membranes now Will wait until lower

## 2018-02-07 LAB — CBC
HEMATOCRIT: 32.1 % — AB (ref 36.0–46.0)
HEMOGLOBIN: 10.5 g/dL — AB (ref 12.0–15.0)
MCH: 28.8 pg (ref 26.0–34.0)
MCHC: 32.7 g/dL (ref 30.0–36.0)
MCV: 88.2 fL (ref 78.0–100.0)
Platelets: 191 10*3/uL (ref 150–400)
RBC: 3.64 MIL/uL — ABNORMAL LOW (ref 3.87–5.11)
RDW: 16.3 % — ABNORMAL HIGH (ref 11.5–15.5)
WBC: 13.5 10*3/uL — ABNORMAL HIGH (ref 4.0–10.5)

## 2018-02-07 MED ORDER — AMLODIPINE BESYLATE 5 MG PO TABS
5.0000 mg | ORAL_TABLET | Freq: Every day | ORAL | Status: DC
  Administered 2018-02-07 – 2018-02-08 (×2): 5 mg via ORAL
  Filled 2018-02-07 (×3): qty 1

## 2018-02-07 MED ORDER — TRAMADOL HCL 50 MG PO TABS: 50.0000 mg | ORAL_TABLET | Freq: Four times a day (QID) | ORAL | Status: DC | PRN

## 2018-02-07 NOTE — Lactation Note (Signed)
This note was copied from a baby's chart. Lactation Consultation Note  Patient Name: Kim Haley ZOXWR'UToday's Date: 02/07/2018 Reason for consult: Follow-up assessment;Difficult latch Baby is 23 hours old and has latched to right side a few times.  Mom is currently attempting to latch baby to left using cross cradle hold.  Mom's nipples erect and breast tissue compressible..  Baby opens wide but unable to grasp breast tissue to latch.  Baby fussy at breast.  Good suck noted on gloved finger.  20 mm nipple shield applied.  After a few attempts baby latched and started sucking.  Baby fed for only 5 minutes then fell asleep.  Assisted with initiating pumping using her Medela pump in style.  Instructed to pump every 3 hours for breast stimulation.  If milk is obtained we will give to baby using a curved tip syringe.  Baby is acting content in crib. Encouraged to call out for concerns/assist prn.  Maternal Data    Feeding Feeding Type: Breast Fed Length of feed: 5 min  LATCH Score Latch: Repeated attempts needed to sustain latch, nipple held in mouth throughout feeding, stimulation needed to elicit sucking reflex.  Audible Swallowing: None  Type of Nipple: Everted at rest and after stimulation  Comfort (Breast/Nipple): Soft / non-tender  Hold (Positioning): Assistance needed to correctly position infant at breast and maintain latch.  LATCH Score: 6  Interventions Interventions: Assisted with latch;Breast compression;Skin to skin;Adjust position;Breast massage;Support pillows;Hand express  Lactation Tools Discussed/Used Tools: Nipple Dorris CarnesShields;Shells Nipple shield size: 20 Shell Type: Inverted Breast pump type: Double-Electric Breast Pump Pump Review: Setup, frequency, and cleaning;Milk Storage Initiated by:: LM Date initiated:: 02/07/18   Consult Status Consult Status: Follow-up Date: 02/08/18 Follow-up type: In-patient    Huston FoleyMOULDEN, Mordecai Tindol S 02/07/2018, 11:53 AM

## 2018-02-07 NOTE — Progress Notes (Addendum)
POSTPARTUM PROGRESS NOTE  Post Partum Day 1 Subjective:  Kim Haley is a 37 y.o. O0H2122 74w1ds/p SVD.  Noted to have higher BP immediately postpartum into 140-150 SBP, but has since decreased 130s SBP.  Pt denies problems with ambulating, voiding or po intake.  She denies nausea or vomiting.  Pain is well controlled. Lochia Moderate.   Objective: Blood pressure (!) 134/96, pulse 96, temperature (!) 97.5 F (36.4 C), temperature source Oral, resp. rate 18, height 5' 6"  (1.676 m), weight (!) 339 lb (153.8 kg), last menstrual period 05/08/2017, SpO2 97 %, unknown if currently breastfeeding.  Physical Exam:  General: alert, cooperative and no distress Lochia:normal flow Chest: no respiratory distress Heart:regular rate, distal pulses intact Abdomen: soft, nontender,  Uterine Fundus: firm, appropriately tender DVT Evaluation: 3+ pitting edema. No calf tenderness, erythema or warmth  Recent Labs    02/05/18 2223 02/06/18 1439  HGB 10.4* 11.3*  HCT 31.8* 35.1*    Assessment/Plan:  ASSESSMENT: ESHARESA KEMPis a 37y.o. [id number]/p SVD, doing well.  CHTN: intermittently hypertensive overnight with some improvement this morning. Will continue to monitor, plan to start antihypertensives if persistently elevated. Feeding: Breast Contraception: OCP Dispo: home tomorrow   LOS: 2 days   AlRory PercyDO 02/07/2018, 6:29 AM      Patient Vitals for the past 24 hrs:  BP Temp Temp src Pulse Resp SpO2  02/07/18 0546 (!) 134/96 (!) 97.5 F (36.4 C) Oral 96 18 97 %  02/07/18 0544 (!) 134/96 (!) 97.5 F (36.4 C) Oral 96 - 97 %  02/07/18 0156 135/88 97.8 F (36.6 C) Oral 100 18 97 %  02/06/18 2146 (!) 154/81 97.6 F (36.4 C) Oral 100 20 98 %  02/06/18 1753 (!) 149/80 98.6 F (37 C) Oral (!) 115 18 97 %  02/06/18 1645 (!) 151/82 98.4 F (36.9 C) Oral (!) 105 18 -  02/06/18 1615 136/78 - - (!) 122 18 -  02/06/18 1600 (!) 145/78 - - (!) 109 18 -  02/06/18 1554 -  100.3 F (37.9 C) Axillary - - -  02/06/18 1545 134/78 - - (!) 109 18 -  02/06/18 1530 (!) 117/50 - - (!) 135 18 -  02/06/18 1515 (!) 141/68 - - (!) 107 18 -  02/06/18 1500 (!) 141/93 - - (!) 106 18 -  02/06/18 1445 113/63 - - (!) 109 18 -  02/06/18 1430 (!) 146/98 (!) 100.6 F (38.1 C) Axillary (!) 114 18 -  02/06/18 1420 (!) 159/72 - - (!) 117 18 -  02/06/18 1400 (!) 153/90 - - (!) 106 18 -  02/06/18 1346 (!) 147/90 - - (!) 121 18 -  02/06/18 1330 (!) 148/83 - - (!) 123 18 -  02/06/18 1315 (!) 149/81 - - (!) 116 18 -  02/06/18 1300 139/75 - - (!) 121 20 -  02/06/18 1245 129/69 - - (!) 123 20 -  02/06/18 1230 127/68 - - (!) 137 20 -  02/06/18 1200 (!) 159/78 - - (!) 125 (!) 22 96 %  02/06/18 1130 (!) 150/103 - - 99 20 98 %  02/06/18 1104 - 100.2 F (37.9 C) Axillary - - -  02/06/18 1100 (!) 149/103 - - (!) 106 18 95 %  02/06/18 1030 (!) 135/94 - - (!) 112 18 -  02/06/18 1000 (!) 145/91 - - (!) 122 18 -  02/06/18 0941 - (!) 100.7 F (38.2 C) Axillary - - -  02/06/18 0930 (!) 144/88 - - (!) 141 18 -  02/06/18 0902 (!) 147/67 - - (!) 113 20 -  02/06/18 0830 139/63 - - (!) 121 20 98 %  02/06/18 0803 (!) 144/73 - - (!) 122 20 -   WIll go ahead and start Norvasc 67m daily.  Has never been on BP meds.  PT to call office and schedule 1 week BP check.  Pt agreeable. HRR&R, Resp unlabored, Legs neg.  FNigel Berthold CNM 02/07/2018 7:24 AM

## 2018-02-08 MED ORDER — IBUPROFEN 600 MG PO TABS: 600.0000 mg | ORAL_TABLET | Freq: Four times a day (QID) | ORAL | 0 refills | Status: DC

## 2018-02-08 MED ORDER — IBUPROFEN 600 MG PO TABS: 600.0000 mg | ORAL_TABLET | Freq: Four times a day (QID) | ORAL | 0 refills | Status: AC

## 2018-02-08 MED ORDER — AMLODIPINE BESYLATE 5 MG PO TABS: 5.0000 mg | ORAL_TABLET | Freq: Every day | ORAL | 0 refills | Status: DC

## 2018-02-08 MED ORDER — AMLODIPINE BESYLATE 5 MG PO TABS: 5.0000 mg | ORAL_TABLET | Freq: Every day | ORAL | 0 refills | Status: AC

## 2018-02-08 NOTE — Lactation Note (Signed)
This note was copied from a baby's chart. Lactation Consultation Note  Patient Name: Kim Haley LKGMW'NToday's Date: 02/08/2018 Reason for consult: Follow-up assessment Mom states baby won't latch even with nipple shield.  Baby is receiving formula bottles.  Mom is pumping every 3 hours and obtaining small amounts of colostrum.  Baby was just fed and is sleeping in crib.  Mom is interested in coming in for an outpatient appointment after milk is in.  Clinic notified.  Maternal Data    Feeding Feeding Type: Formula Nipple Type: Regular  LATCH Score                   Interventions    Lactation Tools Discussed/Used     Consult Status Consult Status: Follow-up Follow-up type: Out-patient    Huston FoleyMOULDEN, Rayme Bui S 02/08/2018, 10:06 AM

## 2018-02-08 NOTE — Discharge Summary (Signed)
OB Discharge Summary    Patient Name: Kim Haley DOB: Jan 16, 1981 MRN: [id number]  Date of admission: 02/05/2018 Delivering MD: Pincus LargePHELPS, JAZMA Y   Date of discharge: 02/08/2018  Admitting diagnosis: 39WKS INDUCTION Intrauterine pregnancy: 7416w1d     Secondary diagnosis:  Active Problems:   Obesity affecting pregnancy in second trimester   Preexisting hypertension complicating pregnancy, antepartum   SVD (spontaneous vaginal delivery)  Additional problems: N/A     Discharge diagnosis: Term Pregnancy Delivered and CHTN                                                                                                Post partum procedures:none  Augmentation: Pitocin, Cytotec and Foley Balloon  Complications: Intrauterine Inflammation or infection (Chorioamniotis)  Hospital course:  Induction of Labor With Vaginal Delivery   37 y.o. yo G2P2002 at 4016w1d was admitted to the hospital 02/05/2018 for induction of labor.  Indication for induction: cHTN.  Patient had a labor course as follows: Membrane Rupture Time/Date: 6:32 AM ,02/06/2018   Intrapartum Procedures: Episiotomy: None [1]                                         Lacerations:  2nd degree [3]  Complicated by maternal fever, received ancef. Patient had delivery of a Viable infant. EBL ~85600ml, received 1000mcg cytotec without further complication.  Information for the patient's newborn:  Forrest MoronSharpe, Girl Lucianne [[id number]][[id number]]     02/06/2018  Details of delivery can be found in separate delivery note.  Patient had a postpartum course notable for elevated BP. She was started on norvasc with good response and will continue outpatient. She will receive a blood pressure check at 1 week postpartum. Patient is discharged home 02/08/18.  Physical exam  Vitals:   02/07/18 1500 02/08/18 0039 02/08/18 0526 02/08/18 0919  BP: (!) 135/93 (!) 145/90 130/89 (!) 131/91  Pulse: (!) 105 84 93 91  Resp: 18 20 20 16   Temp: 98 F (36.7 C) 98.1 F (36.7  C) 98.2 F (36.8 C)   TempSrc: Oral Oral Oral   SpO2: 98%     Weight:      Height:       General: alert, cooperative and no distress, morbidly obese Lochia: appropriate Uterine Fundus: difficult to appreciate due to pannus  Incision: N/A DVT Evaluation: 3+ pitting edema. Negative Homan's sign. No cords or calf tenderness. Labs: Lab Results  Component Value Date   WBC 13.5 (H) 02/07/2018   HGB 10.5 (L) 02/07/2018   HCT 32.1 (L) 02/07/2018   MCV 88.2 02/07/2018   PLT 191 02/07/2018   CMP Latest Ref Rng & Units 02/06/2018  Glucose 70 - 99 mg/dL 97  BUN 6 - 20 mg/dL 10  Creatinine 8.650.44 - 7.841.00 mg/dL 6.960.75  Sodium 295135 - 284145 mmol/L 135  Potassium 3.5 - 5.1 mmol/L 4.0  Chloride 98 - 111 mmol/L 107  CO2 22 - 32 mmol/L 19(L)  Calcium 8.9 - 10.3 mg/dL 8.1(L)  Total Protein 6.5 -  8.1 g/dL 5.2(L)  Total Bilirubin 0.3 - 1.2 mg/dL 0.5  Alkaline Phos 38 - 126 U/L 103  AST 15 - 41 U/L 28  ALT 0 - 44 U/L 12    Discharge instruction: per After Visit Summary and "Baby and Me Booklet".  After visit meds:  Allergies as of 02/08/2018   No Known Allergies     Medication List    STOP taking these medications   aspirin EC 81 MG tablet     TAKE these medications   amLODipine 5 MG tablet Commonly known as:  NORVASC Take 1 tablet (5 mg total) by mouth daily. Start taking on:  02/09/2018   BENADRYL ALLERGY 25 MG tablet Generic drug:  diphenhydrAMINE Take 25 mg by mouth daily as needed for itching (rash).   calcium carbonate 500 MG chewable tablet Commonly known as:  TUMS - dosed in mg elemental calcium Chew 1 tablet by mouth 2 (two) times daily as needed for indigestion or heartburn.   ferrous sulfate 325 (65 FE) MG tablet Take 325 mg by mouth 2 (two) times daily with a meal.   ibuprofen 600 MG tablet Commonly known as:  ADVIL,MOTRIN Take 1 tablet (600 mg total) by mouth every 6 (six) hours.   multivitamin-prenatal 27-0.8 MG Tabs tablet Take 1 tablet by mouth daily at 12  noon.   VITA-C PO Take 1 tablet by mouth daily.      Diet: routine diet  Activity: Advance as tolerated. Pelvic rest for 6 weeks.   Outpatient follow up:1 week BP check, 4-6 weeks postpartum Follow up Appt: Future Appointments  Date Time Provider Department Center  02/12/2018  1:15 PM [facility] NURSE [facility]  03/04/2018  9:30 AM Reva Bores, MD [facility]   Follow up Visit:No follow-ups on file.  Postpartum contraception: Progesterone only pills  Newborn Data: Live born female  Birth Weight: 10 lb (4535 g) APGAR: 6, 8  Newborn Delivery   Time head delivered:  02/06/2018 12:27:00 Birth date/time:  02/06/2018 12:28:00 Delivery type:  Vaginal, Spontaneous    Baby Feeding: Breast Disposition:home with mother  02/08/2018 Ellwood Dense, DO

## 2018-02-08 NOTE — Discharge Instructions (Signed)
Vaginal Delivery, Care After °Refer to this sheet in the next few weeks. These instructions provide you with information about caring for yourself after vaginal delivery. Your health care provider may also give you more specific instructions. Your treatment has been planned according to current medical practices, but problems sometimes occur. Call your health care provider if you have any problems or questions. °What can I expect after the procedure? °After vaginal delivery, it is common to have: °· Some bleeding from your vagina. °· Soreness in your abdomen, your vagina, and the area of skin between your vaginal opening and your anus (perineum). °· Pelvic cramps. °· Fatigue. ° °Follow these instructions at home: °Medicines °· Take over-the-counter and prescription medicines only as told by your health care provider. °· If you were prescribed an antibiotic medicine, take it as told by your health care provider. Do not stop taking the antibiotic until it is finished. °Driving ° °· Do not drive or operate heavy machinery while taking prescription pain medicine. °· Do not drive for 24 hours if you received a sedative. °Lifestyle °· Do not drink alcohol. This is especially important if you are breastfeeding or taking medicine to relieve pain. °· Do not use tobacco products, including cigarettes, chewing tobacco, or e-cigarettes. If you need help quitting, ask your health care provider. °Eating and drinking °· Drink at least 8 eight-ounce glasses of water every day unless you are told not to by your health care provider. If you choose to breastfeed your baby, you may need to drink more water than this. °· Eat high-fiber foods every day. These foods may help prevent or relieve constipation. High-fiber foods include: °? Whole grain cereals and breads. °? Brown rice. °? Beans. °? Fresh fruits and vegetables. °Activity °· Return to your normal activities as told by your health care provider. Ask your health care provider  what activities are safe for you. °· Rest as much as possible. Try to rest or take a nap when your baby is sleeping. °· Do not lift anything that is heavier than your baby or 10 lb (4.5 kg) until your health care provider says that it is safe. °· Talk with your health care provider about when you can engage in sexual activity. This may depend on your: °? Risk of infection. °? Rate of healing. °? Comfort and desire to engage in sexual activity. °Vaginal Care °· If you have an episiotomy or a vaginal tear, check the area every day for signs of infection. Check for: °? More redness, swelling, or pain. °? More fluid or blood. °? Warmth. °? Pus or a bad smell. °· Do not use tampons or douches until your health care provider says this is safe. °· Watch for any blood clots that may pass from your vagina. These may look like clumps of dark red, brown, or black discharge. °General instructions °· Keep your perineum clean and dry as told by your health care provider. °· Wear loose, comfortable clothing. °· Wipe from front to back when you use the toilet. °· Ask your health care provider if you can shower or take a bath. If you had an episiotomy or a perineal tear during labor and delivery, your health care provider may tell you not to take baths for a certain length of time. °· Wear a bra that supports your breasts and fits you well. °· If possible, have someone help you with household activities and help care for your baby for at least a few days after   you leave the hospital. °· Keep all follow-up visits for you and your baby as told by your health care provider. This is important. °Contact a health care provider if: °· You have: °? Vaginal discharge that has a bad smell. °? Difficulty urinating. °? Pain when urinating. °? A sudden increase or decrease in the frequency of your bowel movements. °? More redness, swelling, or pain around your episiotomy or vaginal tear. °? More fluid or blood coming from your episiotomy or  vaginal tear. °? Pus or a bad smell coming from your episiotomy or vaginal tear. °? A fever. °? A rash. °? Little or no interest in activities you used to enjoy. °? Questions about caring for yourself or your baby. °· Your episiotomy or vaginal tear feels warm to the touch. °· Your episiotomy or vaginal tear is separating or does not appear to be healing. °· Your breasts are painful, hard, or turn red. °· You feel unusually sad or worried. °· You feel nauseous or you vomit. °· You pass large blood clots from your vagina. If you pass a blood clot from your vagina, save it to show to your health care provider. Do not flush blood clots down the toilet without having your health care provider look at them. °· You urinate more than usual. °· You are dizzy or light-headed. °· You have not breastfed at all and you have not had a menstrual period for 12 weeks after delivery. °· You have stopped breastfeeding and you have not had a menstrual period for 12 weeks after you stopped breastfeeding. °Get help right away if: °· You have: °? Pain that does not go away or does not get better with medicine. °? Chest pain. °? Difficulty breathing. °? Blurred vision or spots in your vision. °? Thoughts about hurting yourself or your baby. °· You develop pain in your abdomen or in one of your legs. °· You develop a severe headache. °· You faint. °· You bleed from your vagina so much that you fill two sanitary pads in one hour. °This information is not intended to replace advice given to you by your health care provider. Make sure you discuss any questions you have with your health care provider. °Document Released: 07/13/2000 Document Revised: 12/28/2015 Document Reviewed: 07/31/2015 °Elsevier Interactive Patient Education © 2018 [employer] ° °

## 2018-02-12 ENCOUNTER — Inpatient Hospital Stay: Admission: RE | Admit: 2018-02-12 | Payer: 59

## 2018-02-13 ENCOUNTER — Encounter (HOSPITAL_COMMUNITY): Payer: Self-pay | Admitting: Emergency Medicine

## 2018-02-13 ENCOUNTER — Emergency Department (HOSPITAL_COMMUNITY): Payer: 59

## 2018-02-13 ENCOUNTER — Emergency Department (HOSPITAL_COMMUNITY)
Admission: EM | Admit: 2018-02-13 | Discharge: 2018-02-13 | Disposition: A | Discharge status: Home / Self Care | Payer: 59 | Attending: Emergency Medicine | Admitting: Emergency Medicine

## 2018-02-13 DIAGNOSIS — Z79899 Other long term (current) drug therapy: Secondary | ICD-10-CM | POA: Insufficient documentation

## 2018-02-13 DIAGNOSIS — Y929 Unspecified place or not applicable: Secondary | ICD-10-CM | POA: Diagnosis not present

## 2018-02-13 DIAGNOSIS — S42201A Unspecified fracture of upper end of right humerus, initial encounter for closed fracture: Secondary | ICD-10-CM | POA: Diagnosis not present

## 2018-02-13 DIAGNOSIS — Y998 Other external cause status: Secondary | ICD-10-CM | POA: Insufficient documentation

## 2018-02-13 DIAGNOSIS — W01198A Fall on same level from slipping, tripping and stumbling with subsequent striking against other object, initial encounter: Secondary | ICD-10-CM | POA: Insufficient documentation

## 2018-02-13 DIAGNOSIS — Y939 Activity, unspecified: Secondary | ICD-10-CM | POA: Diagnosis not present

## 2018-02-13 DIAGNOSIS — S4991XA Unspecified injury of right shoulder and upper arm, initial encounter: Secondary | ICD-10-CM | POA: Diagnosis present

## 2018-02-13 DIAGNOSIS — S42471A Displaced transcondylar fracture of right humerus, initial encounter for closed fracture: Secondary | ICD-10-CM | POA: Diagnosis not present

## 2018-02-13 MED ORDER — OXYCODONE-ACETAMINOPHEN 5-325 MG PO TABS
1.0000 | ORAL_TABLET | ORAL | Status: DC | PRN
  Administered 2018-02-13: 1 via ORAL
  Filled 2018-02-13: qty 1

## 2018-02-13 MED ORDER — KETOROLAC TROMETHAMINE 30 MG/ML IJ SOLN
30.0000 mg | Freq: Once | INTRAMUSCULAR | Status: AC
  Administered 2018-02-13: 30 mg via INTRAVENOUS
  Filled 2018-02-13: qty 1

## 2018-02-13 MED ORDER — HYDROMORPHONE HCL 1 MG/ML IJ SOLN
1.0000 mg | Freq: Once | INTRAMUSCULAR | Status: AC
  Administered 2018-02-13: 1 mg via INTRAVENOUS
  Filled 2018-02-13: qty 1

## 2018-02-13 MED ORDER — OXYCODONE-ACETAMINOPHEN 5-325 MG PO TABS: 1.0000 | ORAL_TABLET | ORAL | 0 refills | Status: AC | PRN

## 2018-02-13 NOTE — Progress Notes (Signed)
Orthopedic Tech Progress Note Patient Details:  Kim Haley 09-Jul-1981 [id number]  Patient ID: Kim Haley, female   DOB: 09-Jul-1981, 37 y.o.   MRN: [id number]   Kim FordyceJennifer C Ayris Haley 02/13/2018, 2:46 PMCalled [employer] for right upper extremity brace.

## 2018-02-13 NOTE — ED Notes (Signed)
Ortho tech notified of need for shoulder immobilizer.  

## 2018-02-13 NOTE — Progress Notes (Signed)
Orthopedic Tech Progress Note Patient Details:  Lacey Jensenmilie P Bartmess [birth year] [id number]  Ortho Devices Type of Ortho Device: Shoulder immobilizer Ortho Device/Splint Interventions: Application   Post Interventions Patient Tolerated: Well Instructions Provided: Care of device, Adjustment of device   Saul FordyceJennifer C Jaqualin Serpa 02/13/2018, 2:39 PM

## 2018-02-13 NOTE — ED Notes (Signed)
Patient transported to X-ray 

## 2018-02-13 NOTE — ED Triage Notes (Signed)
Patient tripped and fell into a wall and then to the ground. Complains of right shoulder pain, apparent shoulder deformity. Patient denies LOC. Alert, oriented, tearful but in no apparent distress at this time.

## 2018-02-13 NOTE — Discharge Instructions (Addendum)
Return here as needed. Follow up with the orthopedist provided by calling for an appointment.

## 2018-02-13 NOTE — ED Provider Notes (Signed)
[facility] EMERGENCY DEPARTMENT Provider Note   CSN: [id number] Arrival date & time: 02/13/18  1007     History   Chief Complaint Chief Complaint  Patient presents with  . Fall    HPI Kim Haley is a 37 y.o. female.  HPI Patient presents to the emergency department with right shoulder injury that occurred after tripping and falling into a wall and onto the ground earlier today.  The patient states that her right shoulder is very painful.  The patient states that nothing seems to make the condition better but movement and palpation makes the pain worse.  Patient states that she did not take any medications prior to arrival for her symptoms.  Patient denies chest pain, shortness of breath, nausea, vomiting, weakness, headache, blurred vision, back pain, neck pain, abdominal pain, near-syncope or syncope. Past Medical History:  Diagnosis Date  . Complication of anesthesia    woke up during surgery and epidural was one sided  . Morbidly obese (HCC)   . Pregnancy induced hypertension     Patient Active Problem List   Diagnosis Date Noted  . SVD (spontaneous vaginal delivery) 02/06/2018  . Preexisting hypertension complicating pregnancy, antepartum 02/05/2018  . Chronic hypertension during pregnancy, antepartum 01/29/2018  . Labor and delivery indication for care or intervention 01/02/2018  . Anemia of mother in pregnancy, antepartum 06/18/2017  . Chlamydial cervicitis 06/18/2017  . Obesity affecting pregnancy in second trimester 06/14/2017  . Antepartum multigravida of advanced maternal age 85/16/2018  . Elevated blood pressure affecting pregnancy, antepartum 06/14/2017  . Supervision of high risk pregnancy, antepartum 06/14/2017    Past Surgical History:  Procedure Laterality Date  . WISDOM TOOTH EXTRACTION       OB History    Gravida  2   Para  2   Term  2   Preterm      AB      Living  2     SAB      TAB      Ectopic      Multiple  0   Live Births  2            Home Medications    Prior to Admission medications   Medication Sig Start Date End Date Taking? Authorizing Provider  amLODipine (NORVASC) 5 MG tablet Take 1 tablet (5 mg total) by mouth daily. 02/09/18   Ellwood Dense, DO  Ascorbic Acid (VITA-C PO) Take 1 tablet by mouth daily.     [provider]  calcium carbonate (TUMS - DOSED IN MG ELEMENTAL CALCIUM) 500 MG chewable tablet Chew 1 tablet by mouth 2 (two) times daily as needed for indigestion or heartburn.    [provider]  diphenhydrAMINE (BENADRYL ALLERGY) 25 MG tablet Take 25 mg by mouth daily as needed for itching (rash).     [provider]  ferrous sulfate 325 (65 FE) MG tablet Take 325 mg by mouth 2 (two) times daily with a meal.     [provider]  ibuprofen (ADVIL,MOTRIN) 600 MG tablet Take 1 tablet (600 mg total) by mouth every 6 (six) hours. 02/08/18   Ellwood Dense, DO  Prenatal Vit-Fe Fumarate-FA (MULTIVITAMIN-PRENATAL) 27-0.8 MG TABS tablet Take 1 tablet by mouth daily at 12 noon.    [provider]    Family History Family History  Problem Relation Age of Onset  . Cancer Maternal Aunt     Social History Social History   Tobacco Use  .  Smoking status: Never Smoker  . Smokeless tobacco: Never Used  Substance Use Topics  . Alcohol use: No    Alcohol/week: 0.0 oz  . Drug use: No     Allergies   Patient has no known allergies.   Review of Systems Review of Systems  All other systems negative except as documented in the HPI. All pertinent positives and negatives as reviewed in the HPI. Physical Exam Updated Vital Signs BP (!) 154/94   Pulse 95   Resp 18   LMP 05/08/2017   SpO2 96%   Physical Exam  Constitutional: She is oriented to person, place, and time. She appears well-developed and well-nourished. No distress.  HENT:  Head: Normocephalic and atraumatic.  Eyes: Pupils are equal, round, and reactive to  light.  Cardiovascular: Normal rate, regular rhythm and normal heart sounds.  Pulmonary/Chest: Effort normal.  Musculoskeletal:       Right shoulder: She exhibits decreased range of motion, tenderness, bony tenderness and swelling.       Right upper arm: She exhibits tenderness, bony tenderness, swelling and deformity.  Neurological: She is alert and oriented to person, place, and time.  Skin: Skin is warm and dry.  Psychiatric: She has a normal mood and affect.  Nursing note and vitals reviewed.    ED Treatments / Results  Labs (all labs ordered are listed, but only abnormal results are displayed) Labs Reviewed - No data to display  EKG None  Radiology Dg Shoulder Right  Result Date: 02/13/2018 CLINICAL DATA:  Pain following fall EXAM: RIGHT SHOULDER - 2+ VIEW COMPARISON:  None. FINDINGS: Frontal and Y scapular images obtained. There is a comminuted fracture of the proximal humeral metaphysis with slight impaction at the fracture site. There is avulsion along the greater tuberosity. No other acute fractures are evident. No dislocation. There is evidence of an old fracture with remodeling in the lateral right clavicle. No appreciable joint space narrowing or erosion. Visualized right lung clear. IMPRESSION: Acute comminuted fracture proximal humeral metaphysis with mild impaction at fracture site. Avulsion along the greater tuberosity. Old healed fracture lateral right clavicle. No dislocation or appreciable arthropathy. Electronically Signed   By: Bretta BangWilliam  Woodruff III M.D.   On: 02/13/2018 10:54    Procedures Procedures (including critical care time)  Medications Ordered in ED Medications  oxyCODONE-acetaminophen (PERCOCET/ROXICET) 5-325 MG per tablet 1 tablet (1 tablet Oral Given 02/13/18 1054)  HYDROmorphone (DILAUDID) injection 1 mg (1 mg Intravenous Given 02/13/18 1220)  HYDROmorphone (DILAUDID) injection 1 mg (1 mg Intravenous Given 02/13/18 1345)     Initial Impression /  Assessment and Plan / ED Course  I have reviewed the triage vital signs and the nursing notes.  Pertinent labs & imaging results that were available during my care of the patient were reviewed by me and considered in my medical decision making (see chart for details).     I spoke with Dr. Linna CapriceSwinteck of orthopedics who would like to have the patient followed up with Dr. Aundria Rudogers in the next 7 days.  Patient was placed in a shoulder immobilizer with pain control.  Ice to the area.  Told to return here as needed.  Final Clinical Impressions(s) / ED Diagnoses   Final diagnoses:  None    ED Discharge Orders    None       Charlestine NightLawyer, Warren Lindahl, PA-C 02/13/18 1428    Eber HongMiller, Brian, MD 02/13/18 [phone]

## 2018-02-14 DIAGNOSIS — M25511 Pain in right shoulder: Secondary | ICD-10-CM | POA: Diagnosis not present

## 2018-03-03 NOTE — Progress Notes (Signed)
Post Partum Exam  Kim Haley is a 37 y.o. 632P2002 female who presents for a postpartum visit. She is 4 weeks postpartum following a spontaneous vaginal delivery. I have fully reviewed the prenatal and intrapartum course. The delivery was at 39 gestational weeks.  Anesthesia: epidural. Postpartum course has been unremarkable before fracturing right arm. Baby's course has been unremarkable. Baby is feeding by bottle - Similac Advance. Bleeding no bleeding. Bowel function is normal. Bladder function is normal. Patient is not sexually active. Contraception method is OCP (estrogen/progesterone). Postpartum depression screening: positive I have independently reviewed the above information and agree with the documentation. She is having her in-laws keep her daughter due to inability to lift her arm. She is worried, feels threatened, is sad and tearful. She feels guilty. She denies suicidal thoughts or thought of harming others. Has h/o pp depression following birth of her last child.  The following portions of the patient's history were reviewed and updated as appropriate: allergies, current medications, past family history, past medical history, past social history, past surgical history and problem list. Last pap smear done 06/14/2017 and was Abnormal- Chlamydia  Review of Systems Pertinent items noted in HPI and remainder of comprehensive ROS otherwise negative.    Objective:  Blood pressure 131/80, pulse 87, resp. rate 16, height 5' 5.5" (1.664 m), weight 296 lb 12.8 oz (134.6 kg), last menstrual period 05/08/2017, not currently breastfeeding.  General:  alert, cooperative and appears stated age  Lungs: normal effort  Heart:  regular rate and rhythm  Abdomen: soft, non-tender; bowel sounds normal; no masses,  no organomegaly        Assessment:    Postpartum exam. Pap smear not done at today's visit.  Significant pp depression Plan:   1. Contraception: OCP (estrogen/progesterone) Sprintec  prescribed--risks of OC use and age and smoking reviewed at length 2. Begin Zoloft 50 mg daily, begin with 1/2 tab x 5-7 days, usual dosing discussed--precautions reviewed, contracts for safety. 3. Amb referral to integrated behavioral health 4. Pap due 05/2020 5. Follow up in: 4 weeks or as needed.

## 2018-03-04 ENCOUNTER — Ambulatory Visit (INDEPENDENT_AMBULATORY_CARE_PROVIDER_SITE_OTHER): Payer: 59 | Admitting: Family Medicine

## 2018-03-04 ENCOUNTER — Encounter: Payer: Self-pay | Admitting: Family Medicine

## 2018-03-04 VITALS — BP 131/80 | HR 87 | Resp 16 | Ht 65.5 in | Wt 296.8 lb

## 2018-03-04 DIAGNOSIS — S42201D Unspecified fracture of upper end of right humerus, subsequent encounter for fracture with routine healing: Secondary | ICD-10-CM | POA: Diagnosis not present

## 2018-03-04 DIAGNOSIS — Z30011 Encounter for initial prescription of contraceptive pills: Secondary | ICD-10-CM

## 2018-03-04 DIAGNOSIS — F53 Postpartum depression: Secondary | ICD-10-CM

## 2018-03-04 DIAGNOSIS — O99345 Other mental disorders complicating the puerperium: Secondary | ICD-10-CM

## 2018-03-04 MED ORDER — NORGESTIMATE-ETH ESTRADIOL 0.25-35 MG-MCG PO TABS: 1.0000 | ORAL_TABLET | Freq: Every day | ORAL | 11 refills | Status: AC

## 2018-03-04 MED ORDER — SERTRALINE HCL 50 MG PO TABS: 50.0000 mg | ORAL_TABLET | Freq: Every day | ORAL | 1 refills | Status: AC

## 2018-03-04 NOTE — Patient Instructions (Addendum)
Take 1/2 a tablet daily x 5-7 days, then take 1 tablet daily Postpartum Depression and Baby Blues The postpartum period begins right after the birth of a baby. During this time, there is often a great amount of joy and excitement. It is also a time of many changes in the life of the parents. Regardless of how many times a mother gives birth, each child brings new challenges and dynamics to the family. It is not unusual to have feelings of excitement along with confusing shifts in moods, emotions, and thoughts. All mothers are at risk of developing postpartum depression or the "baby blues." These mood changes can occur right after giving birth, or they may occur many months after giving birth. The baby blues or postpartum depression can be mild or severe. Additionally, postpartum depression can go away rather quickly, or it can be a long-term condition. What are the causes? Raised hormone levels and the rapid drop in those levels are thought to be a main cause of postpartum depression and the baby blues. A number of hormones change during and after pregnancy. Estrogen and progesterone usually decrease right after the delivery of your baby. The levels of thyroid hormone and various cortisol steroids also rapidly drop. Other factors that play a role in these mood changes include major life events and genetics. What increases the risk? If you have any of the following risks for the baby blues or postpartum depression, know what symptoms to watch out for during the postpartum period. Risk factors that may increase the likelihood of getting the baby blues or postpartum depression include:  Having a personal or family history of depression.  Having depression while being pregnant.  Having premenstrual mood issues or mood issues related to oral contraceptives.  Having a lot of life stress.  Having marital conflict.  Lacking a social support network.  Having a baby with special needs.  Having health  problems, such as diabetes.  What are the signs or symptoms? Symptoms of baby blues include:  Brief changes in mood, such as going from extreme happiness to sadness.  Decreased concentration.  Difficulty sleeping.  Crying spells, tearfulness.  Irritability.  Anxiety.  Symptoms of postpartum depression typically begin within the first month after giving birth. These symptoms include:  Difficulty sleeping or excessive sleepiness.  Marked weight loss.  Agitation.  Feelings of worthlessness.  Lack of interest in activity or food.  Postpartum psychosis is a very serious condition and can be dangerous. Fortunately, it is rare. Displaying any of the following symptoms is cause for immediate medical attention. Symptoms of postpartum psychosis include:  Hallucinations and delusions.  Bizarre or disorganized behavior.  Confusion or disorientation.  How is this diagnosed? A diagnosis is made by an evaluation of your symptoms. There are no medical or lab tests that lead to a diagnosis, but there are various questionnaires that a health care provider may use to identify those with the baby blues, postpartum depression, or psychosis. Often, a screening tool called the [place] Postnatal Depression Scale is used to diagnose depression in the postpartum period. How is this treated? The baby blues usually goes away on its own in 1-2 weeks. Social support is often all that is needed. You will be encouraged to get adequate sleep and rest. Occasionally, you may be given medicines to help you sleep. Postpartum depression requires treatment because it can last several months or longer if it is not treated. Treatment may include individual or group therapy, medicine, or both to address  any social, physiological, and psychological factors that may play a role in the depression. Regular exercise, a healthy diet, rest, and social support may also be strongly recommended. Postpartum psychosis is more  serious and needs treatment right away. Hospitalization is often needed. Follow these instructions at home:  Get as much rest as you can. Nap when the baby sleeps.  Exercise regularly. Some women find yoga and walking to be beneficial.  Eat a balanced and nourishing diet.  Do little things that you enjoy. Have a cup of tea, take a bubble bath, read your favorite magazine, or listen to your favorite music.  Avoid alcohol.  Ask for help with household chores, cooking, grocery shopping, or running errands as needed. Do not try to do everything.  Talk to people close to you about how you are feeling. Get support from your partner, family members, friends, or other new moms.  Try to stay positive in how you think. Think about the things you are grateful for.  Do not spend a lot of time alone.  Only take over-the-counter or prescription medicine as directed by your health care provider.  Keep all your postpartum appointments.  Let your health care provider know if you have any concerns. Contact a health care provider if: You are having a reaction to or problems with your medicine. Get help right away if:  You have suicidal feelings.  You think you may harm the baby or someone else. This information is not intended to replace advice given to you by your health care provider. Make sure you discuss any questions you have with your health care provider. Document Released: 04/19/2004 Document Revised: 12/22/2015 Document Reviewed: 04/27/2013 Elsevier Interactive Patient Education  2017 [employer]

## 2018-03-06 ENCOUNTER — Ambulatory Visit (INDEPENDENT_AMBULATORY_CARE_PROVIDER_SITE_OTHER): Payer: 59 | Admitting: Clinical

## 2018-03-06 DIAGNOSIS — F4323 Adjustment disorder with mixed anxiety and depressed mood: Secondary | ICD-10-CM | POA: Diagnosis not present

## 2018-03-06 NOTE — BH Specialist Note (Signed)
Integrated Behavioral Health Initial Visit  MRN: [id number] Name: Kim Haley  Number of Integrated Behavioral Health Clinician visits:: 1/6 Session Start time: 9:05  Session End time: 10:10 Total time: 1 hour  Type of Service: Integrated Behavioral Health- Individual/Family Interpretor:No. Interpretor Name and Language: n/a   Warm Hand Off Completed.       SUBJECTIVE: Kim Jensenmilie P Mccollom is a 37 y.o. female accompanied by n/a Patient was referred by Tinnie Gensanya Pratt, MD for depression. Patient reports the following symptoms/concerns: Pt states her primary symptoms are depression, guilt, sleep difficulties, worry, anxiety, irritability after breaking arm one week postpartum, and being unable to completely care for her baby herself while her arm heals. Pt began taking Zoloft one day ago; is open to learning self-coping strategies to use along with Zoloft.  Duration of problem: Postpartum; Severity of problem: moderately severe  OBJECTIVE: Mood: Anxious and Depressed and Affect: Depressed and Tearful Risk of harm to self or others: No plan to harm self or others  LIFE CONTEXT: Family and Social: Pt lives with her husband and newborn; family is caring for baby daily, while pt's broken arm heals School/Work: Maternity leave; husband works full time  Self-Care: Recognizing the importance of self-care Life Changes: Recent childbirth; arm broken in a fall at one week postpartum  GOALS ADDRESSED: Patient will: 1. Reduce symptoms of: anxiety, depression, insomnia and stress 2. Increase knowledge and/or ability of: self-management skills and stress reduction  3. Demonstrate ability to: Increase healthy adjustment to current life circumstances and Increase adequate support systems for patient/family  INTERVENTIONS: Interventions utilized: Mindfulness or Management consultantelaxation Training, Psychoeducation and/or Health Education and Link to [facility]  Standardized Assessments completed: GAD-7 and  PHQ 9  ASSESSMENT: Patient currently experiencing Adjustment disorder with mixed anxious and depressed mood.   Patient may benefit from psychoeducation and brief therapeutic interventions regarding coping with symptoms of depression and anxiety .  PLAN: 1. Follow up with behavioral health clinician on : One week by phone 2. Behavioral recommendations:  -CALM relaxation breathing exercise at least twice daily (morning; at bedtime) -Use meditation and sleep apps nightly at bedtime -Consider additional self-coping strategies, discussed in office visit Read educational materials regarding coping with symptoms of anxiety and depression -Attend Mom Talk support group at least once prior to returning to work 3. Referral(s): Integrated [facility] (In Clinic) and [employer] [facility]:  Mom Talk support 4. "From scale of 1-10, how likely are you to follow plan?": 9  Valetta CloseJamie C McMannes, LCSW    Dep[facility] screen [facility] 03/06/2018  Decreased Interest 2  Down, Depressed, Hopeless 3  PHQ - 2 Score 5  Altered sleeping 3  Tired, decreased energy 2  Change in appetite 2  Feeling bad or failure about yourself  3  Trouble concentrating 1  Moving slowly or fidgety/restless 3  Suicidal thoughts 0  PHQ-9 Score 19   GAD 7 : Generalized Anxiety Score 03/06/2018  Nervous, Anxious, on Edge 2  Control/stop worrying 3  Worry too much - different things 3  Trouble relaxing 2  Restless 2  Easily annoyed or irritable 3  Afraid - awful might happen 1  Total GAD 7 Score 16

## 2018-03-13 ENCOUNTER — Telehealth: Payer: Self-pay | Admitting: Clinical

## 2018-03-13 NOTE — Telephone Encounter (Signed)
Attempt to follow [facility], [facility]; Left HIPPA-compliant message to call back Asher MuirJamie from [facility]  at [phone].

## 2018-03-14 DIAGNOSIS — S42201D Unspecified fracture of upper end of right humerus, subsequent encounter for fracture with routine healing: Secondary | ICD-10-CM | POA: Diagnosis not present

## 2018-03-17 DIAGNOSIS — M25511 Pain in right shoulder: Secondary | ICD-10-CM | POA: Diagnosis not present

## 2018-03-19 DIAGNOSIS — M25511 Pain in right shoulder: Secondary | ICD-10-CM | POA: Diagnosis not present

## 2018-03-20 ENCOUNTER — Ambulatory Visit: Payer: Self-pay

## 2018-03-20 NOTE — BH Specialist Note (Deleted)
Integrated Behavioral Health Follow Up Visit  MRN: [id number] Name: Kim Haley  Number of Integrated Behavioral Health Clinician visits: {IBH Number of Visits:21014052} Session Start time: ***  Session End time: *** Total time: {IBH Total Time:21014050}  Type of Service: Integrated Behavioral Health- Individual/Family Interpretor:{yes [id number]}no:[id number]} Interpretor Name and Language: ***  SUBJECTIVE: Kim Haley is a 37 y.o. female accompanied by {Patient accompanied by:[phone]} Patient was referred by *** for ***. Patient reports the following symptoms/concerns: *** Duration of problem: ***; Severity of problem: {Mild/Moderate/Severe:20260}  OBJECTIVE: Mood: {BHH MOOD:22306} and Affect: {BHH AFFECT:22307} Risk of harm to self or others: {CHL AMB BH Suicide Current Mental Status:21022748}  LIFE CONTEXT: Family and Social: *** School/Work: *** Self-Care: *** Life Changes: ***  GOALS ADDRESSED: Patient will: 1.  Reduce symptoms of: {IBH Symptoms:21014056}  2.  Increase knowledge and/or ability of: {IBH Patient Tools:21014057}  3.  Demonstrate ability to: {IBH Goals:21014053}  INTERVENTIONS: Interventions utilized:  {IBH Interventions:21014054} Standardized Assessments completed: {IBH Screening Tools:21014051}  ASSESSMENT: Patient currently experiencing ***.   Patient may benefit from ***.  PLAN: 1. Follow up with behavioral health clinician on : *** 2. Behavioral recommendations: *** 3. Referral(s): {IBH Referrals:21014055} 4. "From scale of 1-10, how likely are you to follow plan?": ***  Valetta CloseJamie C McMannes, LCSW

## 2018-03-25 DIAGNOSIS — M25511 Pain in right shoulder: Secondary | ICD-10-CM | POA: Diagnosis not present

## 2018-04-02 ENCOUNTER — Institutional Professional Consult (permissible substitution): Payer: Self-pay

## 2018-04-02 NOTE — BH Specialist Note (Deleted)
Integrated Behavioral Health Follow Up Visit  MRN: [id number] Name: Kim Haley  Number of Integrated Behavioral Health Clinician visits: {IBH Number of Visits:21014052} Session Start time: ***  Session End time: *** Total time: {IBH Total Time:21014050}  Type of Service: Integrated Behavioral Health- Individual/Family Interpretor:{yes [id number]} Interpretor Name and Language: ***  SUBJECTIVE: Kim Haley is a 37 y.o. female accompanied by {Patient accompanied by:[phone]} Patient was referred by *** for ***. Patient reports the following symptoms/concerns: *** Duration of problem: ***; Severity of problem: {Mild/Moderate/Severe:20260}  OBJECTIVE: Mood: {BHH MOOD:22306} and Affect: {BHH AFFECT:22307} Risk of harm to self or others: {CHL AMB BH Suicide Current Mental Status:21022748}  LIFE CONTEXT: Family and Social: *** School/Work: *** Self-Care: *** Life Changes: ***  GOALS ADDRESSED: Patient will: 1.  Reduce symptoms of: {IBH Symptoms:21014056}  2.  Increase knowledge and/or ability of: {IBH Patient Tools:21014057}  3.  Demonstrate ability to: {IBH Goals:21014053}  INTERVENTIONS: Interventions utilized:  {IBH Interventions:21014054} Standardized Assessments completed: {IBH Screening Tools:21014051}  ASSESSMENT: Patient currently experiencing ***.   Patient may benefit from ***.  PLAN: 1. Follow up with behavioral health clinician on : *** 2. Behavioral recommendations: *** 3. Referral(s): {IBH Referrals:21014055} 4. "From scale of 1-10, how likely are you to follow plan?": ***  [name], LCSW

## 2018-04-07 ENCOUNTER — Ambulatory Visit: Payer: 59 | Admitting: Family Medicine

## 2018-04-07 ENCOUNTER — Encounter: Payer: Self-pay | Admitting: Family Medicine

## 2018-04-07 NOTE — Progress Notes (Signed)
Patient did not keep appointment today. She may call to reschedule.  

## 2018-04-22 DIAGNOSIS — M25511 Pain in right shoulder: Secondary | ICD-10-CM | POA: Diagnosis not present

## 2018-05-02 DIAGNOSIS — M67911 Unspecified disorder of synovium and tendon, right shoulder: Secondary | ICD-10-CM | POA: Diagnosis not present

## 2018-05-02 DIAGNOSIS — R600 Localized edema: Secondary | ICD-10-CM | POA: Diagnosis not present

## 2018-05-02 DIAGNOSIS — S42211A Unspecified displaced fracture of surgical neck of right humerus, initial encounter for closed fracture: Secondary | ICD-10-CM | POA: Diagnosis not present

## 2018-05-02 DIAGNOSIS — M7581 Other shoulder lesions, right shoulder: Secondary | ICD-10-CM | POA: Diagnosis not present

## 2018-05-02 DIAGNOSIS — M25411 Effusion, right shoulder: Secondary | ICD-10-CM | POA: Diagnosis not present

## 2018-05-02 DIAGNOSIS — M24111 Other articular cartilage disorders, right shoulder: Secondary | ICD-10-CM | POA: Diagnosis not present

## 2019-06-28 ENCOUNTER — Telehealth: Payer: 59 | Admitting: Family

## 2019-06-28 DIAGNOSIS — R05 Cough: Secondary | ICD-10-CM

## 2019-06-28 DIAGNOSIS — R059 Cough, unspecified: Secondary | ICD-10-CM

## 2019-06-28 MED ORDER — PREDNISONE 10 MG (21) PO TBPK: ORAL_TABLET | ORAL | 0 refills | Status: AC

## 2019-06-28 NOTE — Progress Notes (Signed)
We are sorry that you are not feeling well.  Here is how we plan to help!  Based on your presentation I believe you most likely have A cough due to a virus.  This is called viral bronchitis and is best treated by rest, plenty of fluids and control of the cough.  You may use Ibuprofen or Tylenol as directed to help your symptoms.     In addition you may use A non-prescription cough medication called Robitussin DAC. Take 2 teaspoons every 8 hours or Delsym: take 2 teaspoons every 12 hours.  Prednisone 10 mg daily for 6 days (see taper instructions below)  Directions for 6 day taper: Day 1: 2 tablets before breakfast, 1 after both lunch & dinner and 2 at bedtime Day 2: 1 tab before breakfast, 1 after both lunch & dinner and 2 at bedtime Day 3: 1 tab at each meal & 1 at bedtime Day 4: 1 tab at breakfast, 1 at lunch, 1 at bedtime Day 5: 1 tab at breakfast & 1 tab at bedtime Day 6: 1 tab at breakfast   From your responses in the eVisit questionnaire you describe inflammation in the upper respiratory tract which is causing a significant cough.  This is commonly called Bronchitis and has four common causes:    Allergies  Viral Infections  Acid Reflux  Bacterial Infection Allergies, viruses and acid reflux are treated by controlling symptoms or eliminating the cause. An example might be a cough caused by taking certain blood pressure medications. You stop the cough by changing the medication. Another example might be a cough caused by acid reflux. Controlling the reflux helps control the cough.  USE OF BRONCHODILATOR ("RESCUE") INHALERS: There is a risk from using your bronchodilator too frequently.  The risk is that over-reliance on a medication which only relaxes the muscles surrounding the breathing tubes can reduce the effectiveness of medications prescribed to reduce swelling and congestion of the tubes themselves.  Although you feel brief relief from the bronchodilator inhaler, your asthma  may actually be worsening with the tubes becoming more swollen and filled with mucus.  This can delay other crucial treatments, such as oral steroid medications. If you need to use a bronchodilator inhaler daily, several times per day, you should discuss this with your provider.  There are probably better treatments that could be used to keep your asthma under control.     HOME CARE . Only take medications as instructed by your medical team. . Complete the entire course of an antibiotic. . Drink plenty of fluids and get plenty of rest. . Avoid close contacts especially the very young and the elderly . Cover your mouth if you cough or cough into your sleeve. . Always remember to wash your hands . A steam or ultrasonic humidifier can help congestion.   GET HELP RIGHT AWAY IF: . You develop worsening fever. . You become short of breath . You cough up blood. . Your symptoms persist after you have completed your treatment plan MAKE SURE YOU   Understand these instructions.  Will watch your condition.  Will get help right away if you are not doing well or get worse.  Your e-visit answers were reviewed by a board certified advanced clinical practitioner to complete your personal care plan.  Depending on the condition, your plan could have included both over the counter or prescription medications. If there is a problem please reply  once you have received a response from your provider. Your   safety is important to us.  If you have drug allergies check your prescription carefully.    You can use MyChart to ask questions about today's visit, request a non-urgent call back, or ask for a work or school excuse for 24 hours related to this e-Visit. If it has been greater than 24 hours you will need to follow up with your provider, or enter a new e-Visit to address those concerns. You will get an e-mail in the next two days asking about your experience.  I hope that your e-visit has been valuable and  will speed your recovery. Thank you for using e-visits. Greater than 5 minutes, yet less than 10 minutes of time have been spent researching, coordinating, and implementing care for this patient today.  Thank you for the details you included in the comment boxes. Those details are very helpful in determining the best course of treatment for you and help us to provide the best care.  

## 2020-02-11 IMAGING — US US MFM FETAL BPP W/O NON-STRESS
1 series · 16 of 19 positions shown · non-contrast
Comparison: none

[Series 1: us mfm fetal bpp w/o non-stress · 19 acquisitions, 16 frames shown]
[im 1/19]
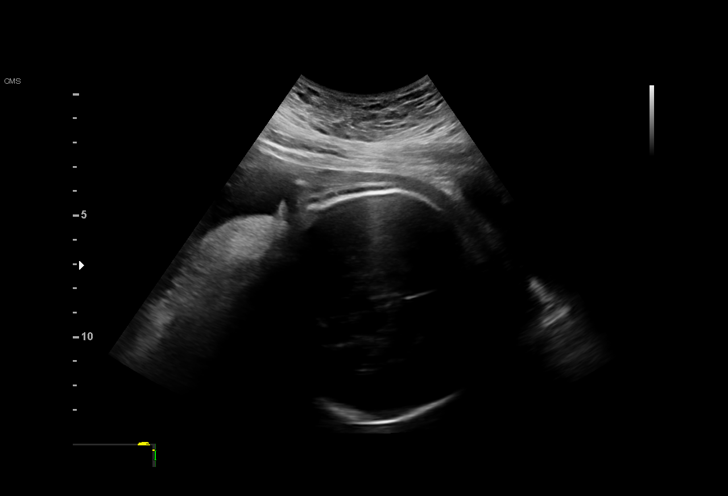
[im 2/19]
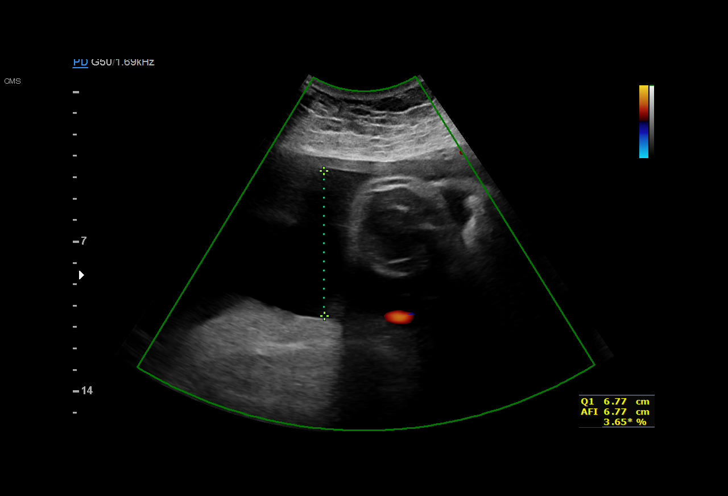
[im 3/19]
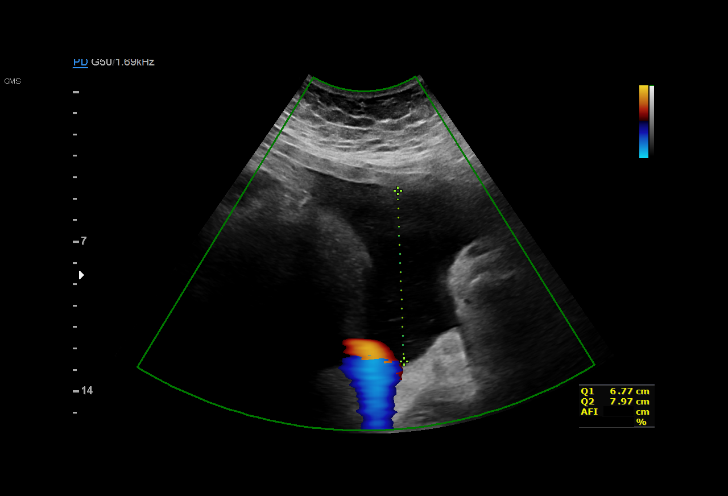
[im 5/19]
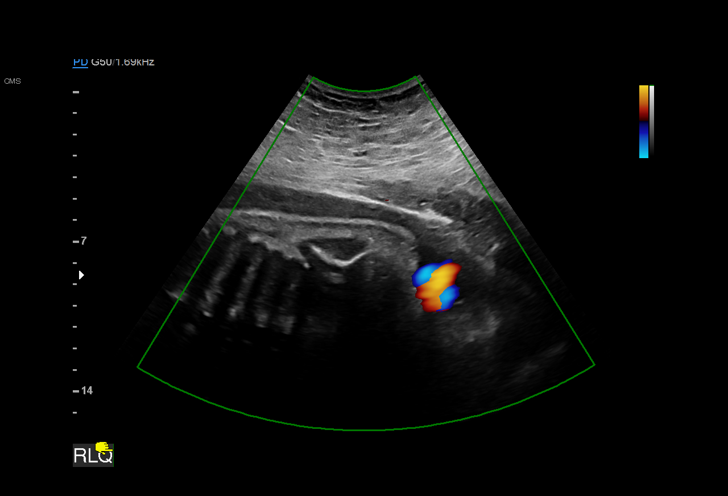
[im 6/19]
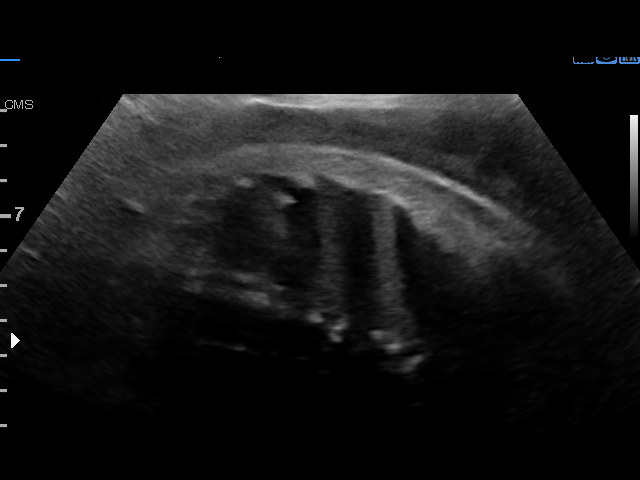
[im 7/19]
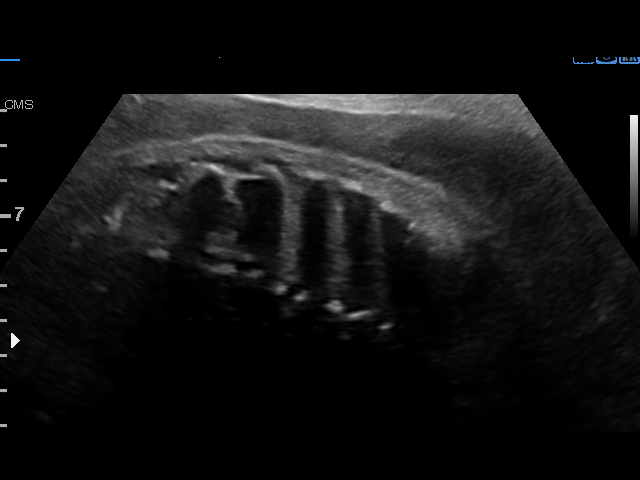
[im 8/19]
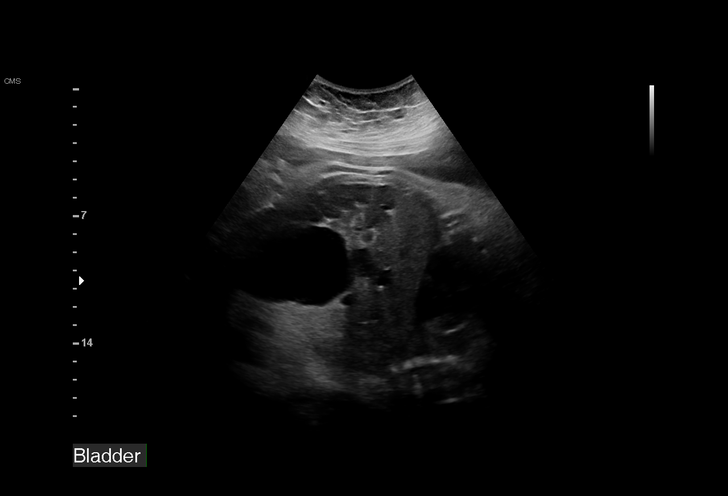
[im 9/19]
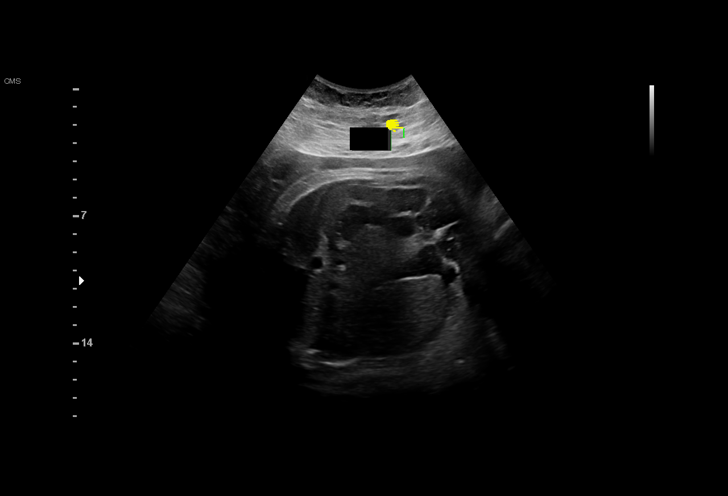
[im 11/19]
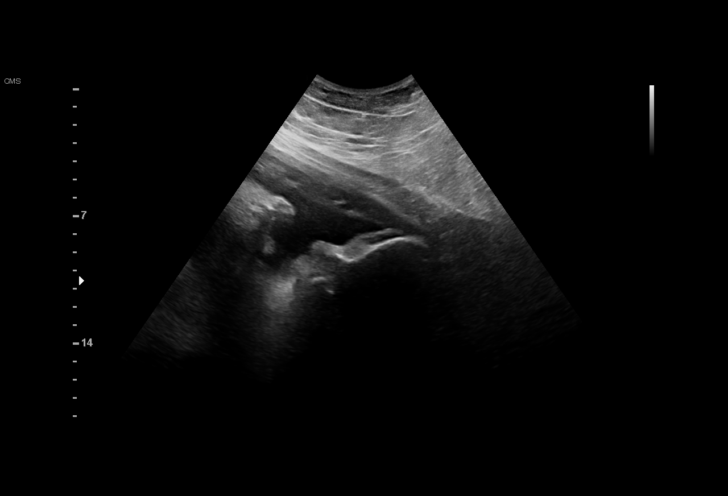
[im 12/19]
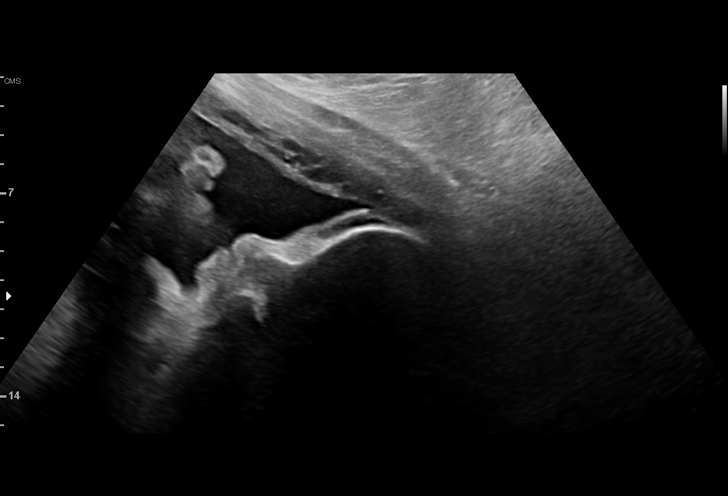
[im 13/19]
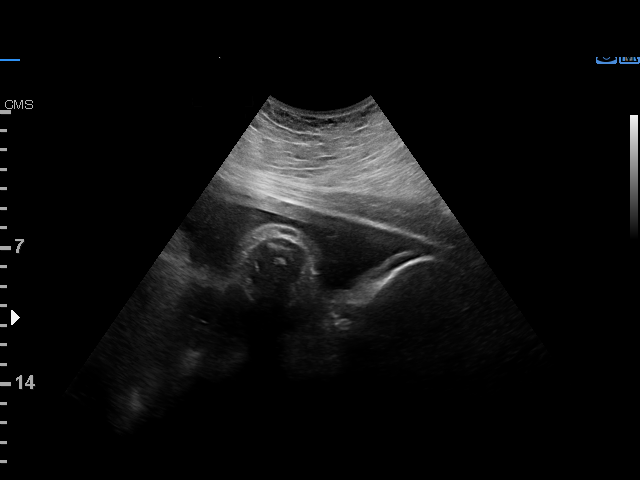
[im 14/19]
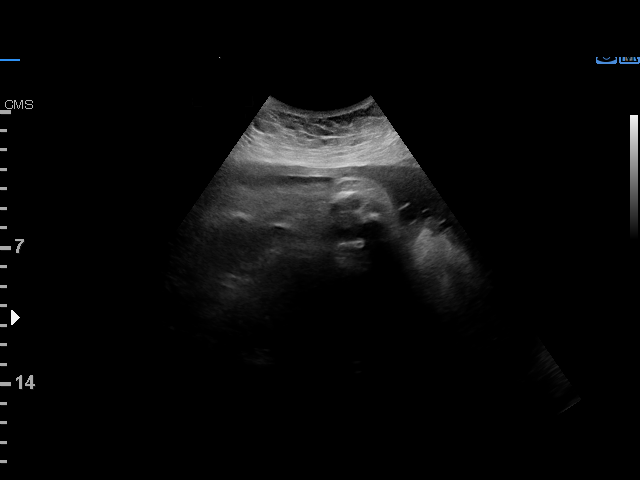
[im 15/19]
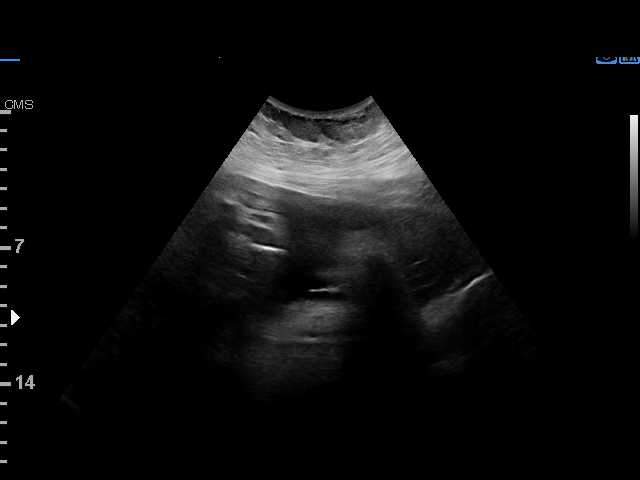
[im 17/19]
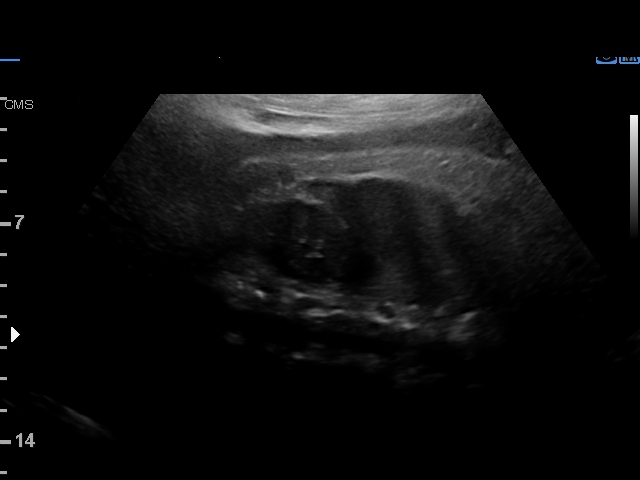
[im 18/19]
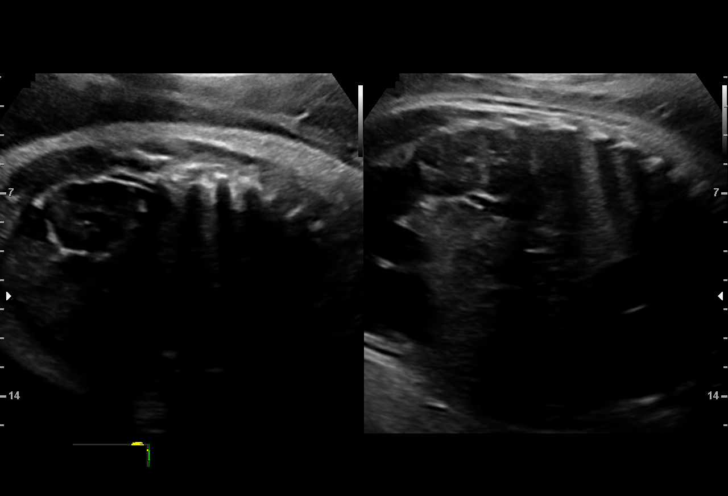
[im 19/19]
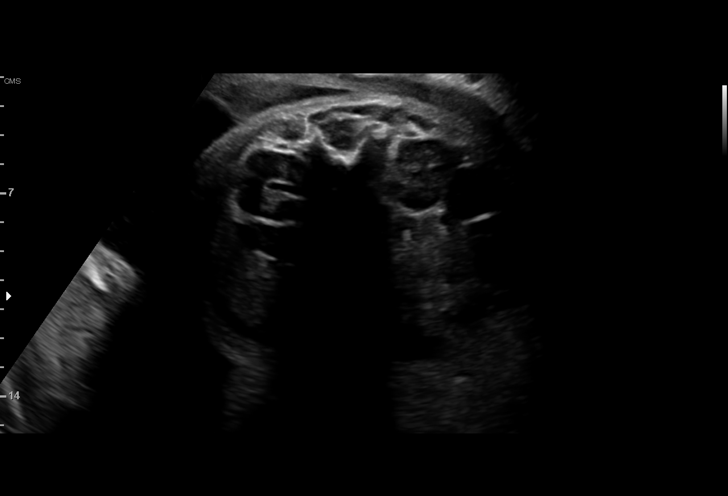

[16 of 19 positions shown; findings below may reference images not displayed]

1  GENKA IRMICI          [id number]      [id number]     [id number]
Indications

38 weeks gestation of pregnancy
Hypertension - Chronic/Pre-existing (ASA)
Advanced maternal age multigravida 35+,
third trimester
Obesity complicating pregnancy, third
trimester
OB History

Gravidity:    2         Term:   1        Prem:   0        SAB:   0
TOP:          0       Ectopic:  0        Living: 1
Fetal Evaluation

Num Of Fetuses:     1
Fetal Heart         136
Rate(bpm):
Cardiac Activity:   Observed
Presentation:       Cephalic

Amniotic Fluid
AFI FV:      Subjectively within normal limits

AFI Sum(cm)     %Tile       Largest Pocket(cm)
17.42           68

RUQ(cm)                     LUQ(cm)        LLQ(cm)
6.77
Biophysical Evaluation

Amniotic F.V:   Within normal limits       F. Tone:         Observed
F. Movement:    Observed                   Score:           [DATE]
F. Breathing:   Observed
Gestational Age

LMP:           38w 0d        Date:  05/08/17                 EDD:   02/12/18
Best:          38w 0d     Det. By:  LMP  (05/08/17)          EDD:   02/12/18
Impression

Antenatal testing is reassuring.
BPP [DATE].
Recommendations

Continue weekly antenatal testing till delivery.

## 2020-02-16 IMAGING — US US MFM FETAL BPP W/O NON-STRESS
1 series · 14 of 24 positions shown · non-contrast
Comparison: none

[Series 1: us mfm fetal bpp w/o non-stress · 24 acquisitions, 14 frames shown]
[im 1/24]
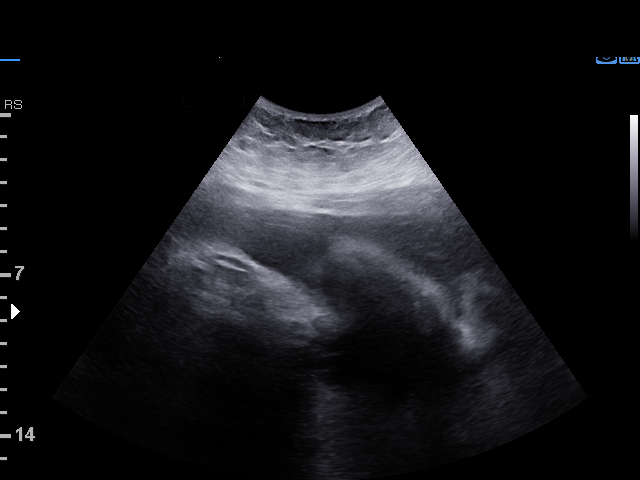
[im 3/24]
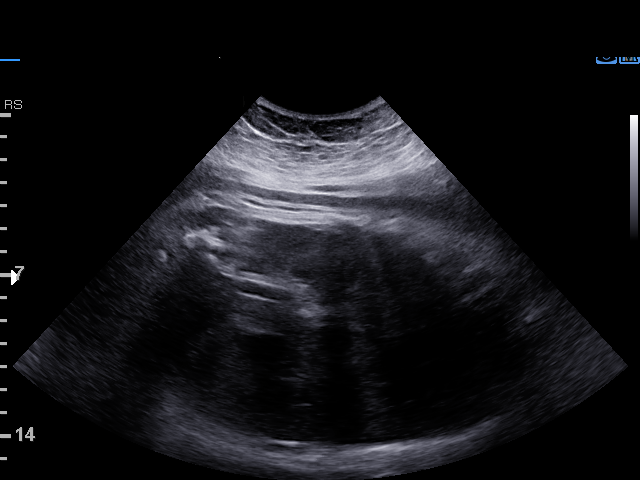
[im 5/24]
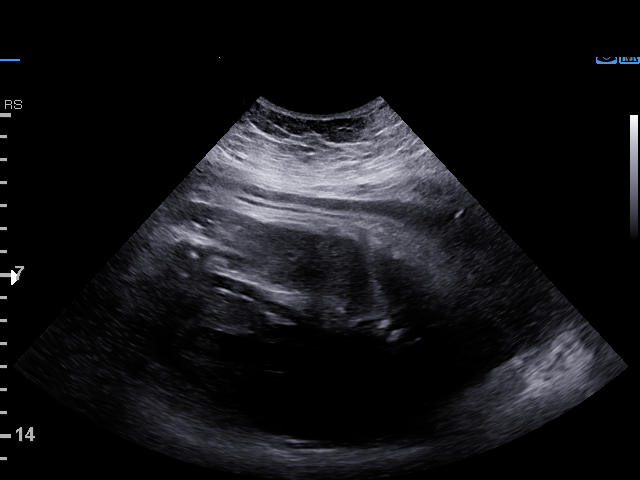
[im 7/24]
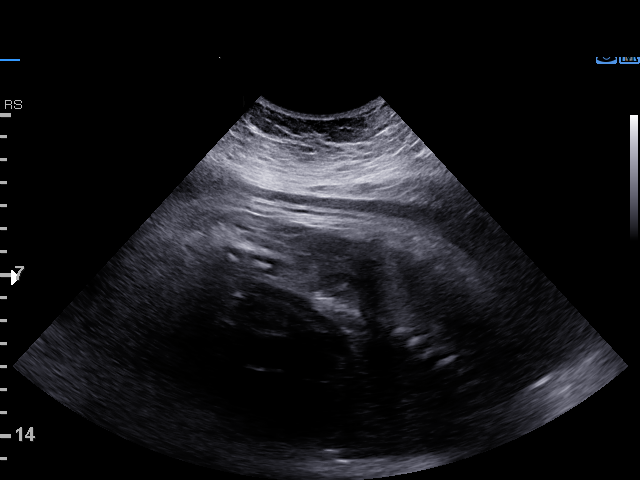
[im 8/24]
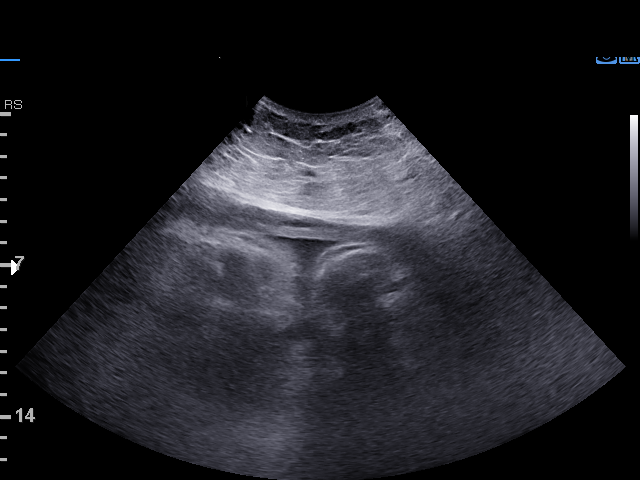
[im 10/24]
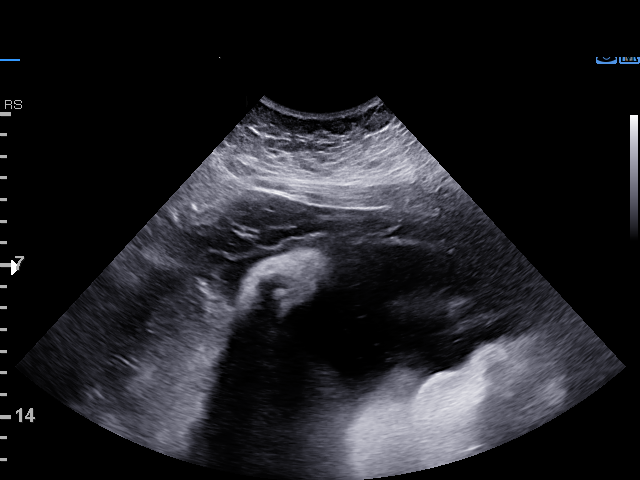
[im 12/24]
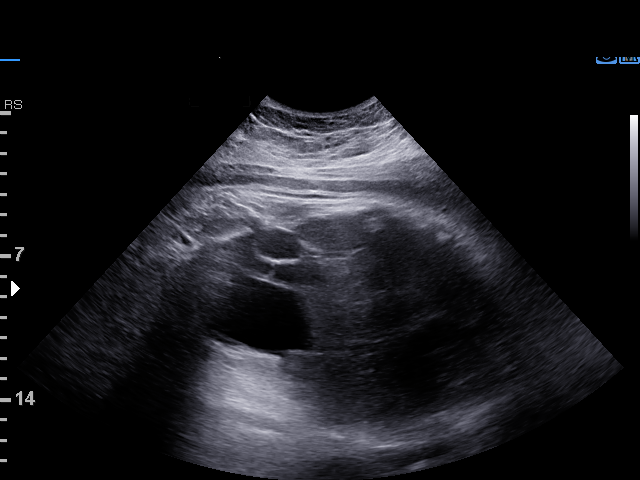
[im 13/24]
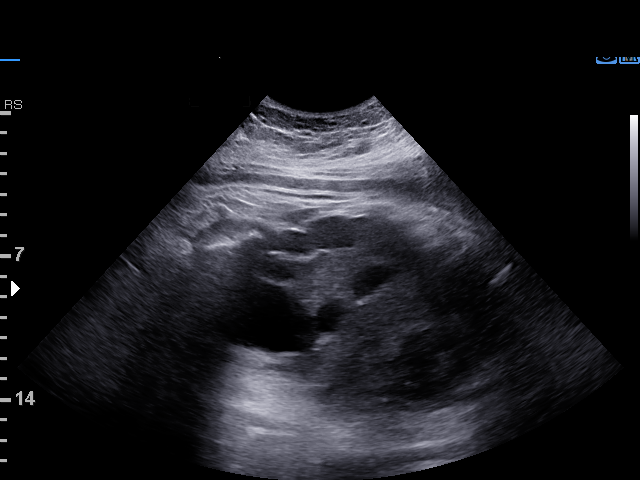
[im 15/24]
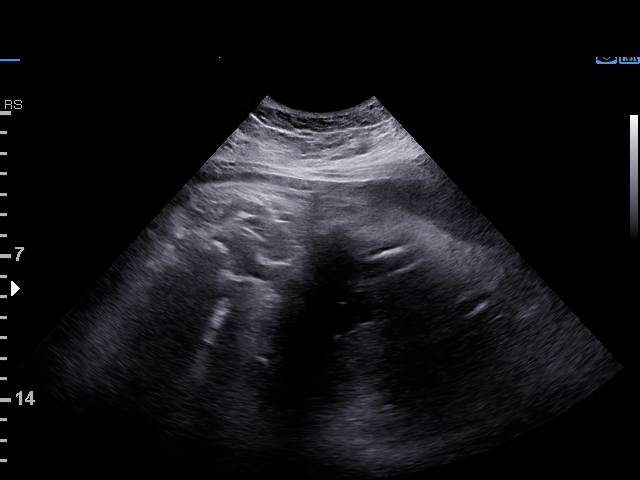
[im 17/24]
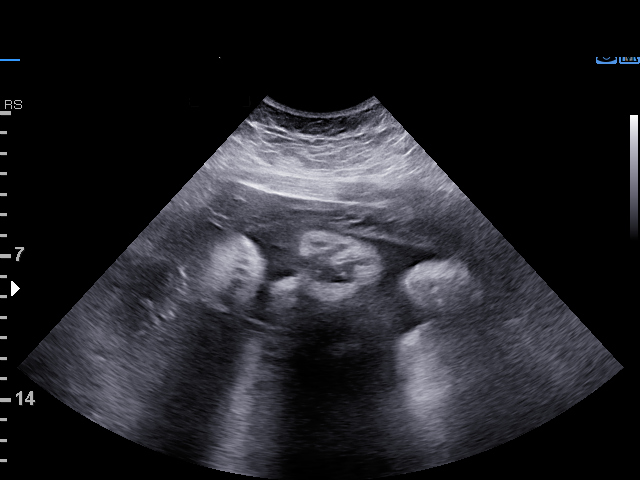
[im 19/24]
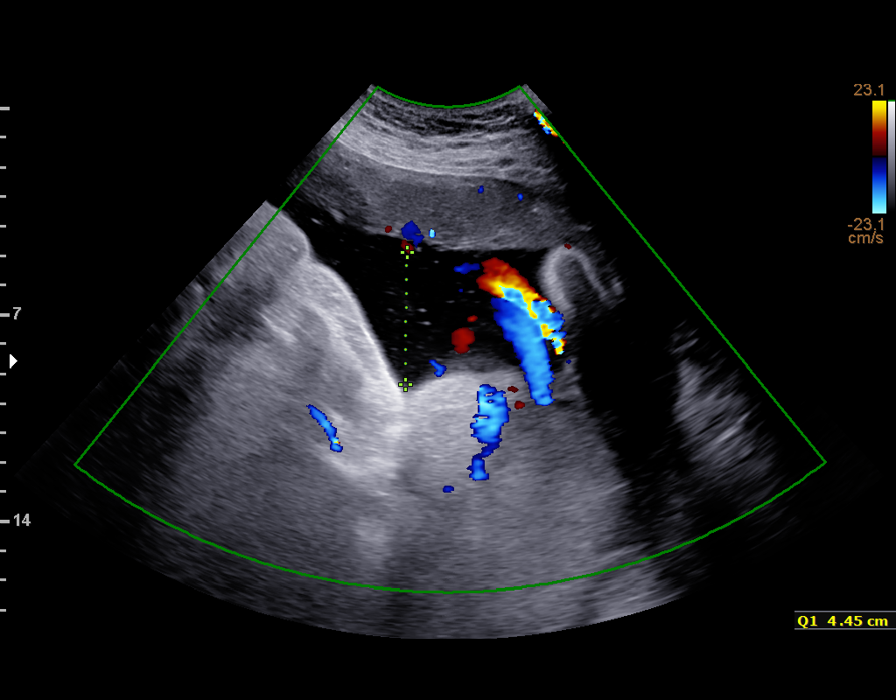
[im 20/24]
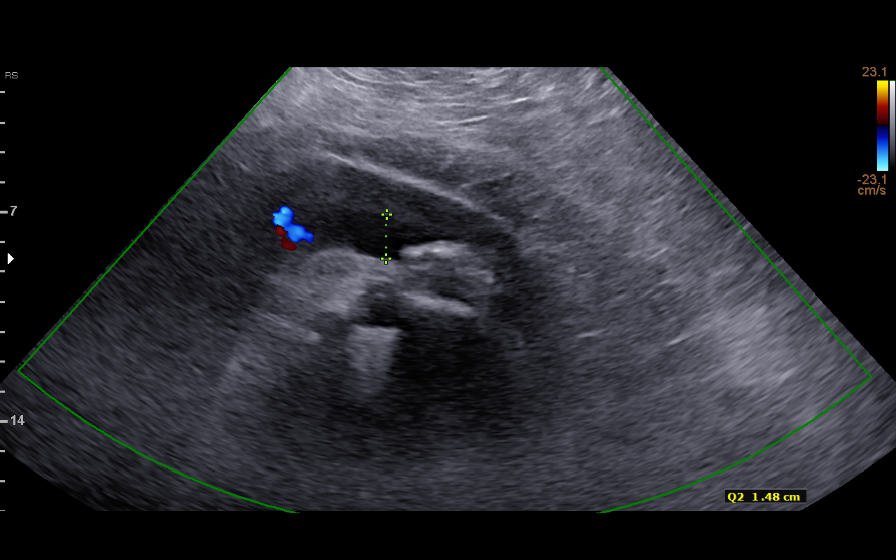
[im 22/24]
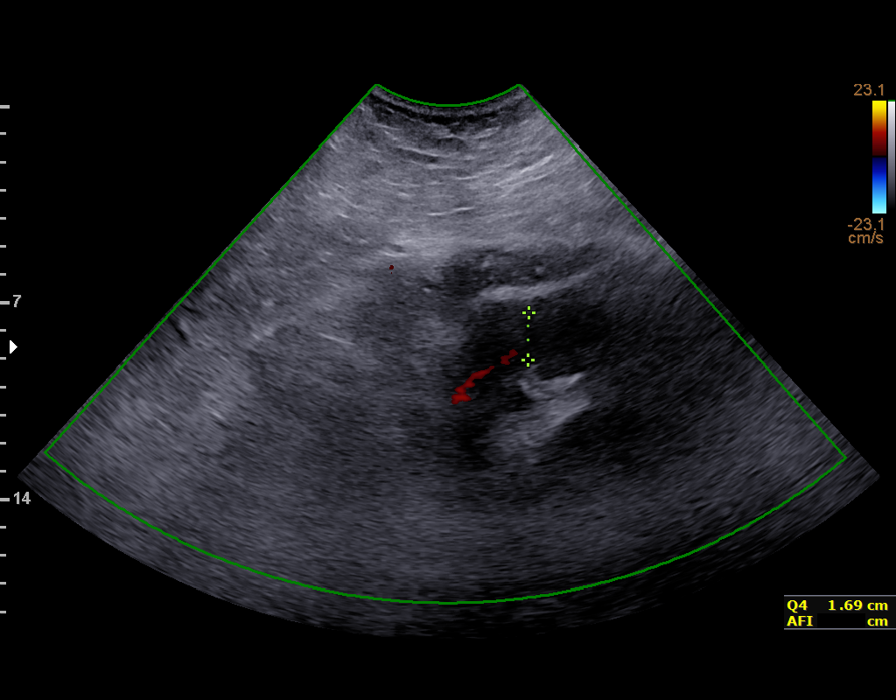
[im 24/24]
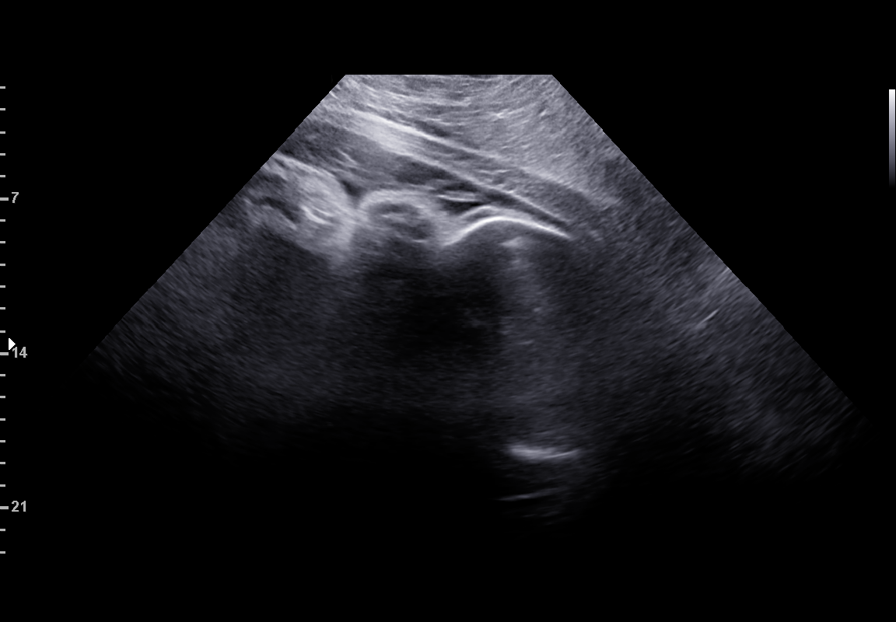

[14 of 24 positions shown; findings below may reference images not displayed]

1  DONOI NICITA           [id number]      [id number]     [id number]
Indications

38 weeks gestation of pregnancy
Hypertension - Chronic/Pre-existing (ASA)
Advanced maternal age multigravida 35+,
third trimester
Obesity complicating pregnancy, third
trimester
OB History

Gravidity:    2         Term:   1        Prem:   0        SAB:   0
TOP:          0       Ectopic:  0        Living: 1
Fetal Evaluation

Num Of Fetuses:     1
Fetal Heart         143
Rate(bpm):
Cardiac Activity:   Observed
Presentation:       Cephalic

Amniotic Fluid
AFI FV:      Subjectively within normal limits

AFI Sum(cm)     %Tile       Largest Pocket(cm)
13.75           54

RUQ(cm)       RLQ(cm)       LUQ(cm)        LLQ(cm)
4.45
Biophysical Evaluation

Amniotic F.V:   Pocket => 2 cm two         F. Tone:        Observed
planes
F. Movement:    Observed                   Score:          [DATE]
F. Breathing:   Observed
Gestational Age

LMP:           38w 5d        Date:  05/08/17                 EDD:   02/12/18
Best:          38w 5d     Det. By:  LMP  (05/08/17)          EDD:   02/12/18
Impression

Antenatal testing is reassuring.
BPP [DATE].
